# Patient Record
Sex: Female | Born: 1950 | Race: White | Hispanic: No | Marital: Married | Smoking: Never smoker
Health system: Southern US, Community
[De-identification: ages and names within clinical notes are randomized; demographics above are authoritative.]

## PROBLEM LIST (undated history)

## (undated) DIAGNOSIS — I10 Essential (primary) hypertension: Secondary | ICD-10-CM

## (undated) DIAGNOSIS — E7800 Pure hypercholesterolemia, unspecified: Secondary | ICD-10-CM

## (undated) DIAGNOSIS — K219 Gastro-esophageal reflux disease without esophagitis: Secondary | ICD-10-CM

## (undated) DIAGNOSIS — M069 Rheumatoid arthritis, unspecified: Secondary | ICD-10-CM

## (undated) DIAGNOSIS — R42 Dizziness and giddiness: Secondary | ICD-10-CM

## (undated) DIAGNOSIS — R55 Syncope and collapse: Secondary | ICD-10-CM

## (undated) HISTORY — PX: PARATHYROIDECTOMY: SHX19

## (undated) HISTORY — PX: ABDOMINAL HYSTERECTOMY: SHX81

## (undated) HISTORY — DX: Gastro-esophageal reflux disease without esophagitis: K21.9

## (undated) HISTORY — DX: Dizziness and giddiness: R42

## (undated) HISTORY — PX: WRIST GANGLION EXCISION: SHX840

## (undated) HISTORY — DX: Syncope and collapse: R55

## (undated) HISTORY — PX: CARDIAC CATHETERIZATION: SHX172

## (undated) HISTORY — PX: NECK SURGERY: SHX720

---

## 2007-05-06 ENCOUNTER — Ambulatory Visit (HOSPITAL_COMMUNITY): Admission: RE | Admit: 2007-05-06 | Discharge: 2007-05-07 | Payer: Self-pay | Admitting: Neurosurgery

## 2010-12-09 NOTE — Op Note (Signed)
NAME:  Connie Peterson, Connie Peterson               ACCOUNT NO.:  [id number]   MEDICAL RECORD NO.:  [id number]          PATIENT TYPE:  OIB   LOCATION:  5151                         FACILITY:  [facility]   PHYSICIAN:  Coletta Memos, M.D.     DATE OF BIRTH:  November 03, 1950   DATE OF PROCEDURE:  05/06/2007  DATE OF DISCHARGE:  05/07/2007                               OPERATIVE REPORT   PREOPERATIVE DIAGNOSES:  1. Cervical spondylosis without myelopathy, C6-7.  2. Cervical radiculopathy, C7, bilateral.   POSTOPERATIVE DIAGNOSES:  1. Cervical spondylosis without myelopathy, C6-7.  2. Cervical radiculopathy, C7, bilateral.   PROCEDURE:  1. Anterior cervical decompression, C6-7.  2. Arthrodesis C6-7, with 7-mm structural allograft.  3. Anterior instrumentation, 16 mm Vectra plate with 29-FA screws.   COMPLICATIONS:  None.   SURGEON:  Coletta Memos, M.D.   ASSISTANT:  None.   ANESTHESIA:  Is general endotracheal.   INDICATIONS:  Connie Peterson is a 60 year old who presented with  significant pain in the upper extremities and some weakness in the  triceps.  MRI showed what was biforaminal narrowing at the C6-7 level  along with osteophytes causing some cord stenosis.  I recommended she  agreed to undergo operative decompression of the C6-7 level.   OPERATIVE NOTE:  Connie Peterson was brought to the operating room, intubated  and placed under a general anesthesia without difficulty.  She was  positioned on a horseshoe headrest supine on the bed.  The neck was held  essentially in neutral position.  She had 5 pounds of traction applied  via a chin strap.  The neck was prepped, and she was draped in a sterile  fashion.  I infiltrated 7 mL 1/2% lidocaine 1:200,000 strength  epinephrine starting from the midline, extending to the medial border of  the left sternocleidomastoid at the level of the cricothyroid membrane.  I opened the skin with a #10 blade, and I took this down sharply to the  platysma.  I dissected  rostrally and caudally using Metzenbaum scissors  in a plane above the platysma.  I opened the platysma in a horizontal  fashion using the same Metzenbaum scissors.  I then dissected inferior  to the platysma rostrally and caudally.  I created an avascular plane  with both sharp and blunt dissection to the cervical spine traversing  corridor between the medial strap muscles and the carotid artery  sternocleidomastoid and jugular vein laterally.  I placed a spinal  needle at what I believed to be C6-7 and also one at C4-5.  X-ray showed  that the upper needle was at C4-5.  I could count down very safely to  the C6-7 level which is what I did.   I proceeded with the diskectomy and decompression first by using a #15  blade to incise the disk.  I then reflected the longus colli muscles  bilaterally and placed a self-retaining retractor to expose the disk  space at C6-7.  I then placed two distraction pins, 1 at C6 and the  other at C7, and distracted the disk space.  I then with  the use of  curettes, pituitary rongeurs, Kerrison punches and a high-speed drill  removed both disk material endplate and osteophytes.  I did this in  order to fully decompress the C6-7 level and both C7 nerve roots.  There  was perfuse venous bleeding laterally overlying the C7 nerve roots.  I  was able to control bleeding on both the right and left sides using  Gelfoam and mild pressure.  After fully decompressing the spinal canal  at C6-7 and both C7 nerve roots, I turned my attention to the  arthrodesis.   I performed the arthrodesis, first by evening the retrieval bodies of C6  and C7 so that they would accept a square graft.  I placed a 7-mm graft  after sizing the level.  The graft was secured into the disk space.  I  removed the traction and also removed the distraction pins.   I turned my attention now to the anterior instrumentation, placing a 16-  mm plate and 4 screws, 2 screws into C6 and 2 screws  into C7.  The  plate, plug and the screws were all in good position.  X-ray showed that  I was at the correct level.  I then irrigated the wound.  I then closed  wound in layered fashion, reapproximating the platysma and subsequently  the subcuticular layers.  I used Dermabond for sterile dressing.  Ms.  Gilmore was extubated, speaking well, moving all extremities when she was  taken to the recovery room.           ______________________________  Coletta Memos, M.D.     KC/MEDQ  D:  05/06/2007  T:  05/07/2007  Job:  [id number]

## 2011-05-07 LAB — BASIC METABOLIC PANEL
BUN: 14
CO2: 29
Sodium: 137

## 2011-05-07 LAB — CBC
MCHC: 34.1
RBC: 4.55
RDW: 12.2
WBC: 7.7

## 2011-12-26 DIAGNOSIS — R55 Syncope and collapse: Secondary | ICD-10-CM

## 2011-12-26 HISTORY — DX: Syncope and collapse: R55

## 2012-01-12 ENCOUNTER — Emergency Department (HOSPITAL_COMMUNITY): Payer: BC Managed Care – PPO

## 2012-01-12 ENCOUNTER — Encounter (HOSPITAL_COMMUNITY): Payer: Self-pay | Admitting: Emergency Medicine

## 2012-01-12 ENCOUNTER — Observation Stay (HOSPITAL_COMMUNITY)
Admission: EM | Admit: 2012-01-12 | Discharge: 2012-01-14 | Disposition: A | Discharge status: Home / Self Care | Payer: BC Managed Care – PPO | Attending: Internal Medicine | Admitting: Internal Medicine

## 2012-01-12 DIAGNOSIS — R55 Syncope and collapse: Secondary | ICD-10-CM

## 2012-01-12 DIAGNOSIS — I10 Essential (primary) hypertension: Secondary | ICD-10-CM

## 2012-01-12 DIAGNOSIS — E876 Hypokalemia: Secondary | ICD-10-CM | POA: Insufficient documentation

## 2012-01-12 DIAGNOSIS — R0789 Other chest pain: Secondary | ICD-10-CM | POA: Insufficient documentation

## 2012-01-12 DIAGNOSIS — E785 Hyperlipidemia, unspecified: Secondary | ICD-10-CM | POA: Diagnosis present

## 2012-01-12 DIAGNOSIS — R42 Dizziness and giddiness: Secondary | ICD-10-CM | POA: Insufficient documentation

## 2012-01-12 DIAGNOSIS — M069 Rheumatoid arthritis, unspecified: Secondary | ICD-10-CM

## 2012-01-12 DIAGNOSIS — Z79899 Other long term (current) drug therapy: Secondary | ICD-10-CM | POA: Insufficient documentation

## 2012-01-12 DIAGNOSIS — K219 Gastro-esophageal reflux disease without esophagitis: Secondary | ICD-10-CM | POA: Diagnosis present

## 2012-01-12 DIAGNOSIS — I079 Rheumatic tricuspid valve disease, unspecified: Secondary | ICD-10-CM | POA: Insufficient documentation

## 2012-01-12 HISTORY — DX: Pure hypercholesterolemia, unspecified: E78.00

## 2012-01-12 HISTORY — DX: Rheumatoid arthritis, unspecified: M06.9

## 2012-01-12 HISTORY — DX: Essential (primary) hypertension: I10

## 2012-01-12 LAB — URINE MICROSCOPIC-ADD ON

## 2012-01-12 LAB — CBC
HCT: 35.5 % — ABNORMAL LOW (ref 36.0–46.0)
MCHC: 35.8 g/dL (ref 30.0–36.0)
MCV: 87.4 fL (ref 78.0–100.0)
Platelets: 271 10*3/uL (ref 150–400)
RBC: 4.06 MIL/uL (ref 3.87–5.11)
RDW: 12.9 % (ref 11.5–15.5)
WBC: 7.2 10*3/uL (ref 4.0–10.5)

## 2012-01-12 LAB — DIFFERENTIAL
Basophils Relative: 0 % (ref 0–1)
Eosinophils Absolute: 0.1 10*3/uL (ref 0.0–0.7)
Eosinophils Relative: 2 % (ref 0–5)
Monocytes Relative: 10 % (ref 3–12)
Neutro Abs: 4 10*3/uL (ref 1.7–7.7)
Neutrophils Relative %: 55 % (ref 43–77)

## 2012-01-12 LAB — URINALYSIS, ROUTINE W REFLEX MICROSCOPIC
Hgb urine dipstick: NEGATIVE
Nitrite: NEGATIVE
Protein, ur: NEGATIVE mg/dL
Urobilinogen, UA: 0.2 mg/dL (ref 0.0–1.0)
pH: 6.5 (ref 5.0–8.0)

## 2012-01-12 LAB — COMPREHENSIVE METABOLIC PANEL
AST: 45 U/L — ABNORMAL HIGH (ref 0–37)
Albumin: 4.4 g/dL (ref 3.5–5.2)
CO2: 27 mEq/L (ref 19–32)
GFR calc Af Amer: >90 mL/min (ref >90)
Glucose, Bld: 95 mg/dL (ref 70–99)
Sodium: 135 mEq/L (ref 135–145)
Total Protein: 7.8 g/dL (ref 6.0–8.3)

## 2012-01-12 LAB — CARDIAC PANEL(CRET KIN+CKTOT+MB+TROPI)
CK, MB: 1.7 ng/mL (ref 0.3–4.0)
Relative Index: 1.6 (ref 0.0–2.5)
Total CK: 109 U/L (ref 7–177)
Troponin I: <0.3 ng/mL (ref <0.30)

## 2012-01-12 LAB — D-DIMER, QUANTITATIVE: D-Dimer, Quant: <0.22 ug/mL-FEU (ref 0.00–0.48)

## 2012-01-12 MED ORDER — SODIUM CHLORIDE 0.9 % IV BOLUS (SEPSIS)
500.0000 mL | Freq: Once | INTRAVENOUS | Status: AC
  Administered 2012-01-12: 500 mL via INTRAVENOUS

## 2012-01-12 MED ORDER — POTASSIUM CHLORIDE CRYS ER 20 MEQ PO TBCR
40.0000 meq | EXTENDED_RELEASE_TABLET | Freq: Once | ORAL | Status: AC
  Administered 2012-01-12: 40 meq via ORAL
  Filled 2012-01-12: qty 2

## 2012-01-12 NOTE — ED Notes (Signed)
Per ems: The patient started on a new Medication. The patient has had several episodes of feeling dizzy and near passing out. 1 episode yesterday of syncope for a brief moment. The patient felt poorly today went Rn - they sen there here. THe patient patient is currently awake / alert and oriented

## 2012-01-12 NOTE — ED Provider Notes (Signed)
History     CSN: [id number]  Arrival date & time 01/12/12  1759   First MD Initiated Contact with Patient 01/12/12 1825      Chief Complaint  Patient presents with  . Near Syncope    (Consider location/radiation/quality/duration/timing/severity/associated sxs/prior treatment) HPI Comments: Pt presents with syncopal episodes.  She was at [facility] yesterday and passed out for about a minute, then that evening , went to a movie and passed out while she was watching the movie.  Today, she had a similar spell while seeing her nurse practioner, but was near syncope.  She says it starts with dizziness, tingling to face, nausea, diaphoresis, then passes out.  No hx of similar spells in past.  No CP or SOB.  No other neuro symptoms.  No headache.  No balance problems.  Only new medication is remicade which she started in May for her RA.  Last injection was 5 weeks ago.  No fevers or recent illnesses.  The history is provided by the patient.    Past Medical History  Diagnosis Date  . Hypertension   . Hypercholesteremia   . Arthritis     Past Surgical History  Procedure Date  . Abdominal hysterectomy   . Neck surgery   . Parathyroidectomy   . Wrist ganglion excision     History reviewed. No pertinent family history.  History  Substance Use Topics  . Smoking status: Never Smoker   . Smokeless tobacco: Never Used  . Alcohol Use: No    OB History    Grav Para Term Preterm Abortions TAB SAB Ect Mult Living                  Review of Systems  Constitutional: Positive for diaphoresis. Negative for fever, chills and fatigue.  HENT: Negative for congestion, rhinorrhea and sneezing.   Eyes: Negative.   Respiratory: Negative for cough, chest tightness and shortness of breath.   Cardiovascular: Negative for chest pain and leg swelling.  Gastrointestinal: Positive for nausea. Negative for vomiting, abdominal pain, diarrhea and blood in stool.  Genitourinary: Negative for frequency,  hematuria, flank pain and difficulty urinating.  Musculoskeletal: Negative for back pain and arthralgias.  Skin: Negative for rash.  Neurological: Positive for dizziness and syncope. Negative for seizures, speech difficulty, weakness, numbness and headaches.    Allergies  Review of patient's allergies indicates no known allergies.  Home Medications   Current Outpatient Rx  Name Route Sig Dispense Refill  . FOLIC ACID 1 MG PO TABS Oral Take 1 mg by mouth daily.    . INFLIXIMAB 100 MG IV SOLR Intravenous Inject 100 mg into the vein See admin instructions. Gets infusion every 8 weeks    . LOSARTAN POTASSIUM-HCTZ 100-25 MG PO TABS Oral Take 1 tablet by mouth daily.    Marland Kitchen METHOTREXATE 2.5 MG PO TABS Oral Take 15 mg by mouth once a week. Every sundday.Marland KitchenMarland KitchenCaution:Chemotherapy. Protect from light.    Marland Kitchen METOPROLOL TARTRATE 100 MG PO TABS Oral Take 100 mg by mouth daily.    Marland Kitchen PANTOPRAZOLE SODIUM 40 MG PO TBEC Oral Take 40 mg by mouth daily.    Marland Kitchen SIMVASTATIN 10 MG PO TABS Oral Take 10 mg by mouth daily.      BP 126/78  Pulse 79  Temp 98.1 F (36.7 C) (Oral)  Resp 12  SpO2 99%  Physical Exam  Constitutional: She is oriented to person, place, and time. She appears well-developed and well-nourished.  HENT:  Head: Normocephalic and  atraumatic.  Eyes: Pupils are equal, round, and reactive to light.  Neck: Normal range of motion. Neck supple.  Cardiovascular: Normal rate, regular rhythm and normal heart sounds.   Pulmonary/Chest: Effort normal and breath sounds normal. No respiratory distress. She has no wheezes. She has no rales. She exhibits no tenderness.  Abdominal: Soft. Bowel sounds are normal. There is no tenderness. There is no rebound and no guarding.  Musculoskeletal: Normal range of motion. She exhibits no edema.  Lymphadenopathy:    She has no cervical adenopathy.  Neurological: She is alert and oriented to person, place, and time. She has normal strength. No cranial nerve deficit  or sensory deficit. GCS eye subscore is 4. GCS verbal subscore is 5. GCS motor subscore is 6.       FTN intact  Skin: Skin is warm and dry. No rash noted.  Psychiatric: She has a normal mood and affect.    ED Course  Procedures (including critical care time)  Results for orders placed during the hospital encounter of 01/12/12  CBC      Component Value Range   WBC 7.2  4.0 - 10.5 K/uL   RBC 4.06  3.87 - 5.11 MIL/uL   Hemoglobin 12.7  12.0 - 15.0 g/dL   HCT 29.5 (*) 62.1 - 30.8 %   MCV 87.4  78.0 - 100.0 fL   MCH 31.3  26.0 - 34.0 pg   MCHC 35.8  30.0 - 36.0 g/dL   RDW 65.7  84.6 - 96.2 %   Platelets 271  150 - 400 K/uL  DIFFERENTIAL      Component Value Range   Neutrophils Relative 55  43 - 77 %   Neutro Abs 4.0  1.7 - 7.7 K/uL   Lymphocytes Relative 33  12 - 46 %   Lymphs Abs 2.4  0.7 - 4.0 K/uL   Monocytes Relative 10  3 - 12 %   Monocytes Absolute 0.7  0.1 - 1.0 K/uL   Eosinophils Relative 2  0 - 5 %   Eosinophils Absolute 0.1  0.0 - 0.7 K/uL   Basophils Relative 0  0 - 1 %   Basophils Absolute 0.0  0.0 - 0.1 K/uL  COMPREHENSIVE METABOLIC PANEL      Component Value Range   Sodium 135  135 - 145 mEq/L   Potassium 3.1 (*) 3.5 - 5.1 mEq/L   Chloride 97  96 - 112 mEq/L   CO2 27  19 - 32 mEq/L   Glucose, Bld 95  70 - 99 mg/dL   BUN 17  6 - 23 mg/dL   Creatinine, Ser 9.52  0.50 - 1.10 mg/dL   Calcium 9.6  8.4 - 84.1 mg/dL   Total Protein 7.8  6.0 - 8.3 g/dL   Albumin 4.4  3.5 - 5.2 g/dL   AST 45 (*) 0 - 37 U/L   ALT 57 (*) 0 - 35 U/L   Alkaline Phosphatase 84  39 - 117 U/L   Total Bilirubin 0.5  0.3 - 1.2 mg/dL   GFR calc non Af Amer >90  >90 mL/min   GFR calc Af Amer >90  >90 mL/min  URINALYSIS, ROUTINE W REFLEX MICROSCOPIC      Component Value Range   Color, Urine YELLOW  YELLOW   APPearance CLEAR  CLEAR   Specific Gravity, Urine 1.011  1.005 - 1.030   pH 6.5  5.0 - 8.0   Glucose, UA NEGATIVE  NEGATIVE mg/dL   Hgb  urine dipstick NEGATIVE  NEGATIVE   Bilirubin  Urine NEGATIVE  NEGATIVE   Ketones, ur NEGATIVE  NEGATIVE mg/dL   Protein, ur NEGATIVE  NEGATIVE mg/dL   Urobilinogen, UA 0.2  0.0 - 1.0 mg/dL   Nitrite NEGATIVE  NEGATIVE   Leukocytes, UA SMALL (*) NEGATIVE  CARDIAC PANEL(CRET KIN+CKTOT+MB+TROPI)      Component Value Range   Total CK 109  7 - 177 U/L   CK, MB 1.7  0.3 - 4.0 ng/mL   Troponin I <0.30  <0.30 ng/mL   Relative Index 1.6  0.0 - 2.5  D-DIMER, QUANTITATIVE      Component Value Range   D-Dimer, Quant <0.22  0.00 - 0.48 ug/mL-FEU  URINE MICROSCOPIC-ADD ON      Component Value Range   Squamous Epithelial / LPF RARE  RARE   WBC, UA 0-2  <3 WBC/hpf   Bacteria, UA RARE  RARE   Dg Chest 2 View  01/12/2012  *RADIOLOGY REPORT*  Clinical Data: Multiple episodes of near syncope the past 2 days.  CHEST - 2 VIEW  Comparison: Two-view chest x-ray 05/02/1007.  Findings: Cardiomediastinal silhouette unremarkable, unchanged. Lungs clear.  Bronchovascular markings normal.  Pulmonary vascularity normal.  No pneumothorax.  No pleural effusions. Visualized bony thorax intact.  Prior C6-7 ACDF.  IMPRESSION: No acute cardiopulmonary disease.  Original Report Authenticated By: Arnell Sieving, M.D.   Ct Head Wo Contrast  01/12/2012  *RADIOLOGY REPORT*  Clinical Data: Multiple episodes of near syncope in the past 2 days.  CT HEAD WITHOUT CONTRAST  Technique:  Contiguous axial images were obtained from the base of the skull through the vertex without contrast.  Comparison: None.  Findings: Ventricular system normal in size and appearance for age. No mass lesion.  No midline shift.  No acute hemorrhage or hematoma.  No extra-axial fluid collections.  No evidence of acute infarction.  No focal brain parenchymal abnormalities.  No focal osseous abnormalities involving the skull.  Visualized paranasal sinuses, mastoid air cells, and middle ear cavities well- aerated.  IMPRESSION: Normal examination.  Original Report Authenticated By: Arnell Sieving, M.D.     Date: 01/12/2012  Rate: 71  Rhythm: normal sinus rhythm  QRS Axis: normal  Intervals: normal  ST/T Wave abnormalities: normal  Conduction Disutrbances:none  Narrative Interpretation:   Old EKG Reviewed: unchanged    1. Syncope       MDM  Pt with 3 syncopal type events.  Nothing to suggest ACS, may be arrhthymia, no signs of infection.  Could be related to remicade, but seems less likely given injection was 5 weeks ago.  Will consult hospitalist for admission        Rolan Bucco, MD 01/12/12 2125

## 2012-01-12 NOTE — H&P (Signed)
PCP:  Patient does not have PCP  Chief Complaint:  Near syncope today  HPI: 61 y/o with history of RA (recently started on remicade), HTN (on losartan-hctz, metoprolol), hypercholesterolemia, who presents to the Ed complaining of syncope and near syncope today.  States that yesterday she had to episodes of syncope.  First episode happened while she was waiting in line and second episode occurred while she was watching a movie.  Patient denies any reported seizure like activity, denied any tongue biting or urinary/bowel incontinence.  Prior to fainting she felt diaphoresis, nausea, and flush feeling.  Then passed out reportedly for half a minute prior to coming back to consciousness.  Today she reports that while she was at work she had a similar episode but denied passing out.  She states that she felt like she was going to faint but then she laid down and felt better.  In the ED patient had CT of head which was interpreted as normal examination.  Chest x ray showed no acute cardiopulmonary disease.  WBC was WNL, BMP was essentially normal except for hypokalemia of 3.1.  CE's were negative.  U/A was mainly WNL's.  EKG showed normal sinus rhythm with no ST elevation or depressions.   Allergies:  No Known Allergies    Past Medical History  Diagnosis Date  . Hypertension   . Hypercholesteremia   . Arthritis     Past Surgical History  Procedure Date  . Abdominal hysterectomy   . Neck surgery   . Parathyroidectomy   . Wrist ganglion excision     Prior to Admission medications   Medication Sig Start Date End Date Taking? Authorizing Provider  folic acid (FOLVITE) 1 MG tablet Take 1 mg by mouth daily.   Yes Historical Provider, MD  inFLIXimab (REMICADE) 100 MG injection Inject 100 mg into the vein See admin instructions. Gets infusion every 8 weeks   Yes Historical Provider, MD  losartan-hydrochlorothiazide (HYZAAR) 100-25 MG per tablet Take 1 tablet by mouth daily.   Yes Historical  Provider, MD  methotrexate (RHEUMATREX) 2.5 MG tablet Take 15 mg by mouth once a week. Every sundday.Marland KitchenMarland KitchenCaution:Chemotherapy. Protect from light.   Yes Historical Provider, MD  metoprolol (LOPRESSOR) 100 MG tablet Take 100 mg by mouth daily.   Yes Historical Provider, MD  pantoprazole (PROTONIX) 40 MG tablet Take 40 mg by mouth daily.   Yes Historical Provider, MD  simvastatin (ZOCOR) 10 MG tablet Take 10 mg by mouth daily.   Yes Historical Provider, MD    Social History:  reports that she has never smoked. She has never used smokeless tobacco. She reports that she does not drink alcohol or use illicit drugs.  History reviewed. No pertinent family history.  Review of Systems:  Constitutional: Denies fever, chills, diaphoresis, appetite change and fatigue.  HEENT: Denies photophobia, eye pain, redness, hearing loss, ear pain, congestion, sore throat, rhinorrhea, sneezing, mouth sores, trouble swallowing, neck pain, neck stiffness and tinnitus.   Respiratory: Denies SOB, DOE, cough, chest tightness,  and wheezing.   Cardiovascular: Denies chest pain, palpitations and leg swelling.  Gastrointestinal: Denies nausea, vomiting, abdominal pain, diarrhea, constipation, blood in stool and abdominal distention.  Genitourinary: Denies dysuria, urgency, frequency, hematuria, flank pain and difficulty urinating.  Musculoskeletal: Denies myalgias, back pain, joint swelling, arthralgias and gait problem.  Skin: Denies pallor, rash and wound.  Neurological: Denies dizziness, seizures, syncope, weakness, light-headedness, numbness and headaches.  Hematological: Denies adenopathy. Easy bruising, personal or family bleeding history  Psychiatric/Behavioral: Denies  suicidal ideation, mood changes, confusion, nervousness, sleep disturbance and agitation   Physical Exam: Blood pressure 126/78, pulse 79, temperature 98.1 F (36.7 C), temperature source Oral, resp. rate 12, SpO2 99.00%. General: Alert, awake,  oriented x3, in no acute distress. HEENT: No bruits, no goiter. Heart: Regular rate and rhythm, without murmurs, rubs, gallops. Lungs: Clear to auscultation bilaterally. Abdomen: Soft, nontender, nondistended, positive bowel sounds. Extremities: No clubbing cyanosis or edema with positive pedal pulses. Neuro: Grossly intact, nonfocal.  Labs on Admission:  Results for orders placed during the hospital encounter of 01/12/12 (from the past 48 hour(s))  CBC     Status: Abnormal   Collection Time   01/12/12  7:00 PM      Component Value Range Comment   WBC 7.2  4.0 - 10.5 K/uL    RBC 4.06  3.87 - 5.11 MIL/uL    Hemoglobin 12.7  12.0 - 15.0 g/dL    HCT 16.1 (*) 09.6 - 46.0 %    MCV 87.4  78.0 - 100.0 fL    MCH 31.3  26.0 - 34.0 pg    MCHC 35.8  30.0 - 36.0 g/dL    RDW 04.5  40.9 - 81.1 %    Platelets 271  150 - 400 K/uL   DIFFERENTIAL     Status: Normal   Collection Time   01/12/12  7:00 PM      Component Value Range Comment   Neutrophils Relative 55  43 - 77 %    Neutro Abs 4.0  1.7 - 7.7 K/uL    Lymphocytes Relative 33  12 - 46 %    Lymphs Abs 2.4  0.7 - 4.0 K/uL    Monocytes Relative 10  3 - 12 %    Monocytes Absolute 0.7  0.1 - 1.0 K/uL    Eosinophils Relative 2  0 - 5 %    Eosinophils Absolute 0.1  0.0 - 0.7 K/uL    Basophils Relative 0  0 - 1 %    Basophils Absolute 0.0  0.0 - 0.1 K/uL   COMPREHENSIVE METABOLIC PANEL     Status: Abnormal   Collection Time   01/12/12  7:00 PM      Component Value Range Comment   Sodium 135  135 - 145 mEq/L    Potassium 3.1 (*) 3.5 - 5.1 mEq/L    Chloride 97  96 - 112 mEq/L    CO2 27  19 - 32 mEq/L    Glucose, Bld 95  70 - 99 mg/dL    BUN 17  6 - 23 mg/dL    Creatinine, Ser 9.14  0.50 - 1.10 mg/dL    Calcium 9.6  8.4 - 78.2 mg/dL    Total Protein 7.8  6.0 - 8.3 g/dL    Albumin 4.4  3.5 - 5.2 g/dL    AST 45 (*) 0 - 37 U/L SLIGHT HEMOLYSIS   ALT 57 (*) 0 - 35 U/L    Alkaline Phosphatase 84  39 - 117 U/L    Total Bilirubin 0.5  0.3 - 1.2  mg/dL    GFR calc non Af Amer >90  >90 mL/min    GFR calc Af Amer >90  >90 mL/min   CARDIAC PANEL(CRET KIN+CKTOT+MB+TROPI)     Status: Normal   Collection Time   01/12/12  7:00 PM      Component Value Range Comment   Total CK 109  7 - 177 U/L    CK, MB 1.7  0.3 - 4.0 ng/mL    Troponin I <0.30  <0.30 ng/mL    Relative Index 1.6  0.0 - 2.5   D-DIMER, QUANTITATIVE     Status: Normal   Collection Time   01/12/12  7:00 PM      Component Value Range Comment   D-Dimer, Quant <0.22  0.00 - 0.48 ug/mL-FEU   URINALYSIS, ROUTINE W REFLEX MICROSCOPIC     Status: Abnormal   Collection Time   01/12/12  7:17 PM      Component Value Range Comment   Color, Urine YELLOW  YELLOW    APPearance CLEAR  CLEAR    Specific Gravity, Urine 1.011  1.005 - 1.030    pH 6.5  5.0 - 8.0    Glucose, UA NEGATIVE  NEGATIVE mg/dL    Hgb urine dipstick NEGATIVE  NEGATIVE    Bilirubin Urine NEGATIVE  NEGATIVE    Ketones, ur NEGATIVE  NEGATIVE mg/dL    Protein, ur NEGATIVE  NEGATIVE mg/dL    Urobilinogen, UA 0.2  0.0 - 1.0 mg/dL    Nitrite NEGATIVE  NEGATIVE    Leukocytes, UA SMALL (*) NEGATIVE   URINE MICROSCOPIC-ADD ON     Status: Normal   Collection Time   01/12/12  7:17 PM      Component Value Range Comment   Squamous Epithelial / LPF RARE  RARE    WBC, UA 0-2  <3 WBC/hpf    Bacteria, UA RARE  RARE     Radiological Exams on Admission: Dg Chest 2 View  01/12/2012  *RADIOLOGY REPORT*  Clinical Data: Multiple episodes of near syncope the past 2 days.  CHEST - 2 VIEW  Comparison: Two-view chest x-ray 05/02/1007.  Findings: Cardiomediastinal silhouette unremarkable, unchanged. Lungs clear.  Bronchovascular markings normal.  Pulmonary vascularity normal.  No pneumothorax.  No pleural effusions. Visualized bony thorax intact.  Prior C6-7 ACDF.  IMPRESSION: No acute cardiopulmonary disease.  Original Report Authenticated By: Arnell Sieving, M.D.   Ct Head Wo Contrast  01/12/2012  *RADIOLOGY REPORT*  Clinical Data:  Multiple episodes of near syncope in the past 2 days.  CT HEAD WITHOUT CONTRAST  Technique:  Contiguous axial images were obtained from the base of the skull through the vertex without contrast.  Comparison: None.  Findings: Ventricular system normal in size and appearance for age. No mass lesion.  No midline shift.  No acute hemorrhage or hematoma.  No extra-axial fluid collections.  No evidence of acute infarction.  No focal brain parenchymal abnormalities.  No focal osseous abnormalities involving the skull.  Visualized paranasal sinuses, mastoid air cells, and middle ear cavities well- aerated.  IMPRESSION: Normal examination.  Original Report Authenticated By: Arnell Sieving, M.D.    Assessment/Plan Active Problems: Syncope: - at this point suspecting orthostatic hypotension given history - Will look for other causes of syncope. Obtain Echocardiogram, carotid dopplers, orthostatic vitals - Monitor in telemetry for signs of arrythmias - given that patient did not have any seizure like activity reported will defer EEG at this juncture - Could be related to recent remicaid medication given that this can produce hypotension and there have been averse reactions reported for seizures but given history it is not consistent with seizure like activity.  HTN Stable. Will plan on continuing metoprolol at this juncture.  Hypokalemia - likely related to hctz.  Will go ahead and hold at this juncture. - replace orally x 1 today.  HPL - stable continue statin  GERD - Continue  protonix  DVT -heparin  RA - continue methotrexate at this juncture.  Disposition:  Pending results of lab imaging studies and lab work up.  Should work up come back negative patient may benefit from tilt table testing or change in her antihypertensive medication.  Will place care manager consult so patient can obtain list of PCP's in the area.   Time Spent on Admission: > 55 minutes discussing case with ed doctor,  placing orders, medical decision making, documenting, physical exam, obtaining history from patient, billing, updating information systems.  Penny Pia [facility] Pager: [phone] 01/12/2012, 9:40 PM

## 2012-01-13 ENCOUNTER — Encounter (HOSPITAL_COMMUNITY): Payer: Self-pay | Admitting: Physician Assistant

## 2012-01-13 ENCOUNTER — Observation Stay (HOSPITAL_COMMUNITY): Payer: BC Managed Care – PPO

## 2012-01-13 DIAGNOSIS — E876 Hypokalemia: Secondary | ICD-10-CM

## 2012-01-13 DIAGNOSIS — I369 Nonrheumatic tricuspid valve disorder, unspecified: Secondary | ICD-10-CM

## 2012-01-13 DIAGNOSIS — I10 Essential (primary) hypertension: Secondary | ICD-10-CM

## 2012-01-13 DIAGNOSIS — R55 Syncope and collapse: Secondary | ICD-10-CM

## 2012-01-13 DIAGNOSIS — M069 Rheumatoid arthritis, unspecified: Secondary | ICD-10-CM

## 2012-01-13 LAB — CBC
Hemoglobin: 11.3 g/dL — ABNORMAL LOW (ref 12.0–15.0)
MCH: 30.5 pg (ref 26.0–34.0)
MCHC: 35 g/dL (ref 30.0–36.0)
MCV: 87.3 fL (ref 78.0–100.0)

## 2012-01-13 LAB — BASIC METABOLIC PANEL
BUN: 13 mg/dL (ref 6–23)
CO2: 25 mEq/L (ref 19–32)
Calcium: 9.2 mg/dL (ref 8.4–10.5)
Creatinine, Ser: 0.7 mg/dL (ref 0.50–1.10)
GFR calc non Af Amer: >90 mL/min (ref >90)
Glucose, Bld: 90 mg/dL (ref 70–99)
Sodium: 139 mEq/L (ref 135–145)

## 2012-01-13 LAB — MAGNESIUM: Magnesium: 1.9 mg/dL (ref 1.5–2.5)

## 2012-01-13 LAB — POTASSIUM: Potassium: 3.5 mEq/L (ref 3.5–5.1)

## 2012-01-13 MED ORDER — POTASSIUM CHLORIDE CRYS ER 20 MEQ PO TBCR
40.0000 meq | EXTENDED_RELEASE_TABLET | Freq: Once | ORAL | Status: AC
  Administered 2012-01-13: 40 meq via ORAL
  Filled 2012-01-13: qty 2

## 2012-01-13 MED ORDER — SODIUM CHLORIDE 0.9 % IV SOLN: 250.0000 mL | INTRAVENOUS | Status: DC | PRN

## 2012-01-13 MED ORDER — SODIUM CHLORIDE 0.9 % IJ SOLN
3.0000 mL | Freq: Two times a day (BID) | INTRAMUSCULAR | Status: DC
  Administered 2012-01-13: 3 mL via INTRAVENOUS

## 2012-01-13 MED ORDER — METOPROLOL TARTRATE 100 MG PO TABS
100.0000 mg | ORAL_TABLET | Freq: Every day | ORAL | Status: DC
  Administered 2012-01-13: 100 mg via ORAL
  Filled 2012-01-13 (×2): qty 1

## 2012-01-13 MED ORDER — PANTOPRAZOLE SODIUM 40 MG PO TBEC
40.0000 mg | DELAYED_RELEASE_TABLET | Freq: Every day | ORAL | Status: DC
  Administered 2012-01-13: 40 mg via ORAL
  Filled 2012-01-13 (×2): qty 1

## 2012-01-13 MED ORDER — METHOTREXATE 2.5 MG PO TABS: 15.0000 mg | ORAL_TABLET | ORAL | Status: DC

## 2012-01-13 MED ORDER — MAGNESIUM SULFATE IN D5W 10-5 MG/ML-% IV SOLN
1.0000 g | Freq: Once | INTRAVENOUS | Status: AC
  Administered 2012-01-13: 1 g via INTRAVENOUS
  Filled 2012-01-13: qty 100

## 2012-01-13 MED ORDER — POTASSIUM CHLORIDE IN NACL 20-0.9 MEQ/L-% IV SOLN
INTRAVENOUS | Status: AC
  Administered 2012-01-13 – 2012-01-14 (×2): via INTRAVENOUS
  Filled 2012-01-13 (×2): qty 1000

## 2012-01-13 MED ORDER — HEPARIN SODIUM (PORCINE) 5000 UNIT/ML IJ SOLN
5000.0000 [IU] | Freq: Three times a day (TID) | INTRAMUSCULAR | Status: DC
  Administered 2012-01-13 – 2012-01-14 (×4): 5000 [IU] via SUBCUTANEOUS
  Filled 2012-01-13 (×7): qty 1

## 2012-01-13 MED ORDER — ACETAMINOPHEN 650 MG RE SUPP: 650.0000 mg | Freq: Four times a day (QID) | RECTAL | Status: DC | PRN

## 2012-01-13 MED ORDER — FOLIC ACID 1 MG PO TABS
1.0000 mg | ORAL_TABLET | Freq: Every day | ORAL | Status: DC
  Administered 2012-01-13: 1 mg via ORAL
  Filled 2012-01-13 (×2): qty 1

## 2012-01-13 MED ORDER — IOHEXOL 350 MG/ML SOLN
100.0000 mL | Freq: Once | INTRAVENOUS | Status: AC | PRN
  Administered 2012-01-13: 100 mL via INTRAVENOUS

## 2012-01-13 MED ORDER — SODIUM CHLORIDE 0.9 % IJ SOLN: 3.0000 mL | Freq: Two times a day (BID) | INTRAMUSCULAR | Status: DC

## 2012-01-13 MED ORDER — SIMVASTATIN 10 MG PO TABS
10.0000 mg | ORAL_TABLET | Freq: Every day | ORAL | Status: DC
  Administered 2012-01-13: 10 mg via ORAL
  Filled 2012-01-13 (×2): qty 1

## 2012-01-13 MED ORDER — SODIUM CHLORIDE 0.9 % IJ SOLN: 3.0000 mL | INTRAMUSCULAR | Status: DC | PRN

## 2012-01-13 MED ORDER — ACETAMINOPHEN 325 MG PO TABS: 650.0000 mg | ORAL_TABLET | Freq: Four times a day (QID) | ORAL | Status: DC | PRN

## 2012-01-13 MED ORDER — ONDANSETRON HCL 4 MG/2ML IJ SOLN: 4.0000 mg | Freq: Four times a day (QID) | INTRAMUSCULAR | Status: DC | PRN

## 2012-01-13 MED ORDER — ONDANSETRON HCL 4 MG PO TABS: 4.0000 mg | ORAL_TABLET | Freq: Four times a day (QID) | ORAL | Status: DC | PRN

## 2012-01-13 NOTE — Progress Notes (Addendum)
[facility]                                                                                Patient Demographics  Connie Peterson, is a 61 y.o. female  ZOX:[id number]  [id number]  DOB - 13-Nov-1950  Admit date - 01/12/2012  Admitting Physician Earlean Polka, MD  Outpatient Primary MD for the patient is No primary provider on file.  LOS - 1   Chief Complaint  Patient presents with  . Near Syncope        Subjective:   Connie Peterson today has, No headache, No chest pain, No abdominal pain - No Nausea, No new weakness tingling or numbness, No Cough - SOB.    Objective:   Filed Vitals:   01/13/12 0510 01/13/12 0515 01/13/12 1103 01/13/12 1352  BP: 148/86 147/84 156/77 120/82  Pulse: 62 66 82 60  Temp:    97.8 F (36.6 C)  TempSrc:    Oral  Resp:      Height:      Weight:      SpO2:    98%    Wt Readings from Last 3 Encounters:  01/13/12 78.3 kg (172 lb 9.9 oz)     Intake/Output Summary (Last 24 hours) at 01/13/12 1638 Last data filed at 01/13/12 1629  Gross per 24 hour  Intake    360 ml  Output   1475 ml  Net  -1115 ml    Exam Awake Alert, Oriented *3, No new F.N deficits, Normal affect Reynolds.AT,PERRAL Supple Neck,No JVD, No cervical lymphadenopathy appriciated.  Symmetrical Chest wall movement, Good air movement bilaterally, CTAB RRR,No Gallops,Rubs or new Murmurs, No Parasternal Heave +ve B.Sounds, Abd Soft, Non tender, No organomegaly appriciated, No rebound -guarding or rigidity. No Cyanosis, Clubbing or edema, No new Rash or bruise     Data Review  CBC  Lab 01/13/12 0452 01/12/12 1900  WBC 5.9 7.2  HGB 11.3* 12.7  HCT 32.3* 35.5*  PLT 223 271  MCV 87.3 87.4  MCH 30.5 31.3  MCHC 35.0 35.8  RDW 13.2 12.9  LYMPHSABS -- 2.4  MONOABS -- 0.7  EOSABS -- 0.1  BASOSABS -- 0.0  BANDABS -- --    Chemistries   Lab 01/13/12 0452 01/12/12 1900  NA 139 135  K 3.4* 3.1*  CL 106 97  CO2 25 27  GLUCOSE 90 95  BUN 13 17   CREATININE 0.70 0.73  CALCIUM 9.2 9.6  MG -- --  AST -- 45*  ALT -- 57*  ALKPHOS -- 84  BILITOT -- 0.5   ------------------------------------------------------------------------------------------------------------------ estimated creatinine clearance is 76 ml/min (by C-G formula based on Cr of 0.7). ------------------------------------------------------------------------------------------------------------------ No results found for this basename: HGBA1C:2 in the last 72 hours ------------------------------------------------------------------------------------------------------------------ No results found for this basename: CHOL:2,HDL:2,LDLCALC:2,TRIG:2,CHOLHDL:2,LDLDIRECT:2 in the last 72 hours ------------------------------------------------------------------------------------------------------------------ No results found for this basename: TSH,T4TOTAL,FREET3,T3FREE,THYROIDAB in the last 72 hours ------------------------------------------------------------------------------------------------------------------ No results found for this basename: VITAMINB12:2,FOLATE:2,FERRITIN:2,TIBC:2,IRON:2,RETICCTPCT:2 in the last 72 hours  Coagulation profile No results found for this basename: INR:5,PROTIME:5 in the last 168 hours   Basename 01/12/12 1900  DDIMER <0.22    Cardiac Enzymes  Lab 01/12/12 1900  CKMB  1.7  TROPONINI <0.30  MYOGLOBIN --   ------------------------------------------------------------------------------------------------------------------ No components found with this basename: POCBNP:3  Micro Results No results found for this or any previous visit (from the past 240 hour(s)).  Radiology Reports Dg Chest 2 View  01/12/2012  *RADIOLOGY REPORT*  Clinical Data: Multiple episodes of near syncope the past 2 days.  CHEST - 2 VIEW  Comparison: Two-view chest x-ray 05/02/1007.  Findings: Cardiomediastinal silhouette unremarkable, unchanged. Lungs clear.   Bronchovascular markings normal.  Pulmonary vascularity normal.  No pneumothorax.  No pleural effusions. Visualized bony thorax intact.  Prior C6-7 ACDF.  IMPRESSION: No acute cardiopulmonary disease.  Original Report Authenticated By: Arnell Sieving, M.D.   Ct Head Wo Contrast  01/12/2012  *RADIOLOGY REPORT*  Clinical Data: Multiple episodes of near syncope in the past 2 days.  CT HEAD WITHOUT CONTRAST  Technique:  Contiguous axial images were obtained from the base of the skull through the vertex without contrast.  Comparison: None.  Findings: Ventricular system normal in size and appearance for age. No mass lesion.  No midline shift.  No acute hemorrhage or hematoma.  No extra-axial fluid collections.  No evidence of acute infarction.  No focal brain parenchymal abnormalities.  No focal osseous abnormalities involving the skull.  Visualized paranasal sinuses, mastoid air cells, and middle ear cavities well- aerated.  IMPRESSION: Normal examination.  Original Report Authenticated By: Arnell Sieving, M.D.    Scheduled Meds:   . folic acid  1 mg Oral Daily  . heparin  5,000 Units Subcutaneous Q8H  . methotrexate  15 mg Oral Weekly  . metoprolol  100 mg Oral Daily  . pantoprazole  40 mg Oral Daily  . potassium chloride  40 mEq Oral Once  . potassium chloride  40 mEq Oral Once  . simvastatin  10 mg Oral Daily  . sodium chloride  500 mL Intravenous Once  . sodium chloride  3 mL Intravenous Q12H  . sodium chloride  3 mL Intravenous Q12H  . DISCONTD: methotrexate  15 mg Oral Weekly   Continuous Infusions:  PRN Meds:.sodium chloride, acetaminophen, acetaminophen, ondansetron (ZOFRAN) IV, ondansetron, sodium chloride  Assessment & Plan    Syncope:   - Likely orthostatic hypotension VS Vaso Vagal given history , tele stable , Cards input as may benefit from Event Monitor. - Will look for other causes of syncope. Pending Echocardiogram, carotid dopplers stable, not orthostatic    -  given that patient did not have any seizure like activity reported will defer EEG at this juncture  - Could be related to recent remicaid medication given that this can produce hypotension and there have been averse reactions reported for seizures but given history it is not consistent with seizure like activity- if reoccurs will request CPC to DC this new Med.   Addendum - 5 beat run of NSVT at 6.30pm, check lytes, IV Mag, D/W Cards RV large on echo(being read) will check Ct angio stat.    HTN   Stable. Will plan on continuing metoprolol at this juncture. DC HCTZ.    HPL  - stable continue statin     GERD  - Continue protonix     RA  - continue methotrexate at this juncture.   Low K - Replace and recheck   DVT Prophylaxis  SCDs - Heparin      Leroy Sea M.D on 01/13/2012 at 4:38 PM  Between 7am to 7pm - Pager - [phone]  After 7pm go to [email or web address] - password  [email or web address]  And look for the night coverage person covering for me after hours  [facility] Office  [phone]

## 2012-01-13 NOTE — Progress Notes (Signed)
*  PRELIMINARY RESULTS* Vascular Ultrasound Carotid Duplex (Doppler) has been completed.  Preliminary findings: Bilaterally no significant ICA stenosis with antegrade vertebral flow.  Farrel Demark, RDMS Elpidio Galea, RDMS, RDCS 01/13/2012, 10:52 AM

## 2012-01-13 NOTE — ED Notes (Signed)
Assisting patient with repositioning in bed and patient  Stated that feelings of syncope starts with a warm flushed feeling and nausea that happens first prior to syncopal episode.

## 2012-01-13 NOTE — Progress Notes (Signed)
  Echocardiogram 2D Echocardiogram has been performed.  Connie Peterson 01/13/2012, 2:21 PM

## 2012-01-13 NOTE — Progress Notes (Signed)
Paged/texted  to night on call MD/NP K Craige Cotta  about result  Of K+level and mag level and CT angio as ordered, no new order received.

## 2012-01-13 NOTE — Progress Notes (Signed)
Patient had a 5 beat run of V-tach asymptomatic at time of irregular rhythm notified Dr. Thedore Mins new orders received.

## 2012-01-13 NOTE — Progress Notes (Signed)
Pt's am Orthostatic VS Lying 129/82, 66,  Sitting 148/86, 62,  Standing 147/84, 66. Will continue to montior.Newman Nip Greenwood

## 2012-01-13 NOTE — Consult Note (Signed)
CARDIOLOGY CONSULT NOTE  Patient ID: Connie Peterson, MRN: [id number], DOB/AGE: Jul 13, 1951 61 y.o. Admit date: 01/12/2012   Date of Consult: 01/13/2012 Primary Cardiologist: New  Chief Complaint: almost passed out  HPI: 61 y/o F with hx RA, HTN, HL presented with presyncope. Last week while in line at [employer] she had an episode of nausea, lightheadedness and dizziness & felt like she was going to pass out. She sat down and got some water, then got something to eat and felt better but about 1/2 hour later while in a 3D movie the same thing happened again. She leaned back in the chair and waited for it to pass; by the time the movie was over she felt better. She took her BP at home and it was 98 systolic. The patient denied any reported seizure like activity, denied any tongue biting or urinary/bowel incontinence. Yesterday while at work she had a similar episode and felt like she was going to pass out with associated face tingling; she laid down then saw the NP at her work and BP was 160/90, CBG was 103 - she was subsequently sent to the ER. No palpitations, CP, SOB preceding either event. No total LOC.  She recently started Remicade for RA and got her 3rd treatment in May. Otherwise no recent med changes. No recent CP, SOB. No recent vomiting, diarrhea, visual changes at other times. No recent increase in stress. She does note she gets dizzy when standing sometimes. Initial orthostatics on admission showed Orthostatic VS Lying 129/82, 66, Sitting 148/86, 62, Standing 147/84, 66. She is not tachycardic, tachypnic, or hypoxic. Labs are significant for hypokalemia, mild anemia (Hgb 11.3), D-dimer negative, CEs neg x 1. Carotid dopplers neg so far. No events on telemetry.   Past Medical History  Diagnosis Date  . Hypertension   . Hypercholesteremia   . Rheumatoid arthritis       Most Recent Cardiac Studies: 2D echo pending   Surgical History:  Past Surgical History  Procedure Date  .  Abdominal hysterectomy   . Neck surgery   . Parathyroidectomy   . Wrist ganglion excision      Home Meds: Prior to Admission medications   Medication Sig Start Date End Date Taking? Authorizing Provider  folic acid (FOLVITE) 1 MG tablet Take 1 mg by mouth daily.   Yes Historical Provider, MD  inFLIXimab (REMICADE) 100 MG injection Inject 100 mg into the vein See admin instructions. Gets infusion every 8 weeks   Yes Historical Provider, MD  losartan-hydrochlorothiazide (HYZAAR) 100-25 MG per tablet Take 1 tablet by mouth daily.   Yes Historical Provider, MD  methotrexate (RHEUMATREX) 2.5 MG tablet Take 15 mg by mouth once a week. Every sundday.Marland KitchenMarland KitchenCaution:Chemotherapy. Protect from light.   Yes Historical Provider, MD  metoprolol (LOPRESSOR) 100 MG tablet Take 100 mg by mouth daily.   Yes Historical Provider, MD  pantoprazole (PROTONIX) 40 MG tablet Take 40 mg by mouth daily.   Yes Historical Provider, MD  simvastatin (ZOCOR) 10 MG tablet Take 10 mg by mouth daily.   Yes Historical Provider, MD    Inpatient Medications:     . folic acid  1 mg Oral Daily  . heparin  5,000 Units Subcutaneous Q8H  . methotrexate  15 mg Oral Weekly  . metoprolol  100 mg Oral Daily  . pantoprazole  40 mg Oral Daily  . potassium chloride  40 mEq Oral Once  . simvastatin  10 mg Oral Daily  . sodium chloride  500 mL Intravenous Once  . sodium chloride  3 mL Intravenous Q12H  . sodium chloride  3 mL Intravenous Q12H  . DISCONTD: methotrexate  15 mg Oral Weekly    Allergies: No Known Allergies  History   Social History  . Marital Status: Married    Spouse Name: N/A    Number of Children: N/A  . Years of Education: N/A   Occupational History  . Not on file.   Social History Main Topics  . Smoking status: Never Smoker   . Smokeless tobacco: Never Used  . Alcohol Use: No  . Drug Use: No  . Sexually Active:    Other Topics Concern  . Not on file   Social History Narrative  . No narrative on  file     Family History  Problem Relation Age of Onset  . Heart failure Mother      Review of Systems: General: negative for chills, fever, night sweats or weight changes.  Cardiovascular: negative for chest pain, edema, orthopnea, palpitations, paroxysmal nocturnal dyspnea, shortness of breath or dyspnea on exertion. See above Dermatological: negative for rash Respiratory: negative for cough or wheezing Urologic: negative for hematuria Abdominal: negative for vomiting, diarrhea, bright red blood per rectum, melena, or hematemesis. +nausea Neurologic: see above All other systems reviewed and are otherwise negative except as noted above.  Labs:  [facility] 01/12/12 1900  CKTOTAL 109  CKMB 1.7  TROPONINI <0.30   Lab Results  Component Value Date   WBC 5.9 01/13/2012   HGB 11.3* 01/13/2012   HCT 32.3* 01/13/2012   MCV 87.3 01/13/2012   PLT 223 01/13/2012     Lab 01/13/12 0452 01/12/12 1900  NA 139 --  K 3.4* --  CL 106 --  CO2 25 --  BUN 13 --  CREATININE 0.70 --  CALCIUM 9.2 --  PROT -- 7.8  BILITOT -- 0.5  ALKPHOS -- 84  ALT -- 57*  AST -- 45*  GLUCOSE 90 --    Lab Results  Component Value Date   DDIMER <0.22 01/12/2012    Radiology/Studies:  1. Chest 2 View 01/12/2012  *RADIOLOGY REPORT*  Clinical Data: Multiple episodes of near syncope the past 2 days.  CHEST - 2 VIEW  Comparison: Two-view chest x-ray 05/02/1007.  Findings: Cardiomediastinal silhouette unremarkable, unchanged. Lungs clear.  Bronchovascular markings normal.  Pulmonary vascularity normal.  No pneumothorax.  No pleural effusions. Visualized bony thorax intact.  Prior C6-7 ACDF.  IMPRESSION: No acute cardiopulmonary disease.  Original Report Authenticated By: Arnell Sieving, M.D.   2. Ct Head Wo Contrast 01/12/2012  *RADIOLOGY REPORT*  Clinical Data: Multiple episodes of near syncope in the past 2 days.  CT HEAD WITHOUT CONTRAST  Technique:  Contiguous axial images were obtained from the base of the  skull through the vertex without contrast.  Comparison: None.  Findings: Ventricular system normal in size and appearance for age. No mass lesion.  No midline shift.  No acute hemorrhage or hematoma.  No extra-axial fluid collections.  No evidence of acute infarction.  No focal brain parenchymal abnormalities.  No focal osseous abnormalities involving the skull.  Visualized paranasal sinuses, mastoid air cells, and middle ear cavities well- aerated.  IMPRESSION: Normal examination.  Original Report Authenticated By: Arnell Sieving, M.D.   EKG: NSR 71bpm TWI III, no prior to compare to. Normal PR, QTc  Physical Exam: Blood pressure 120/82, pulse 60, temperature 97.8 F (36.6 C), temperature source Oral, resp. rate 18, height 5'  3" (1.6 m), weight 172 lb 9.9 oz (78.3 kg), SpO2 98.00%. General: Well developed, well nourished WF in no acute distress. Head: Normocephalic, atraumatic, sclera non-icteric, no xanthomas, nares are without discharge.  Neck: Negative for carotid bruits. JVD not elevated. Lungs: Clear bilaterally to auscultation without wheezes, rales, or rhonchi. Breathing is unlabored. Heart: RRR with S1 S2. Very slight outflow tract murmur, slight increase with valsalva. No rubs or gallops appreciated. Abdomen: Soft, non-tender, non-distended with normoactive bowel sounds. No hepatomegaly. No rebound/guarding. No obvious abdominal masses. Msk:  Strength and tone appear normal for age. Extremities: No clubbing or cyanosis. No edema.  Distal pedal pulses are 2+ and equal bilaterally. Neuro: Alert and oriented X 3. Moves all extremities spontaneously. Psych:  Responds to questions appropriately with a normal affect.   Assessment and Plan:   1. Multiple episodes of presyncope 2. H/o RA, on remicade 3. Hypokalemia 4. Mild anemia, should be followed as OP 5. H/o HTN  The patient was seen and examined in conjunction with Dr. Gala Romney. See below for comprehensive  thoughts.  Signed, Ronie Spies PA-C 01/13/2012, 4:03 PM   Patient seen and examined with Ronie Spies, PA-C. We discussed all aspects of the encounter. I agree with the assessment and plan as stated above. Labs, tele and previous notes reviewed personally.   I suspect her episodes are likely due to vasovagal syncope (vs orthostasis). I do not see any events on telemetry to explain. Await echo results. Would suggest stopping her HCTZ, liberalizing salt and fluid intake and following up on echo. If echo normal can d/c home with outpatient event monitor (we will arrange). If events continue can consider outpatient tilt-table testing though I do not think this is necessary at this point. I discussed this with her in detail.   Angelette Ganus,MD 4:13 PM

## 2012-01-14 ENCOUNTER — Encounter (HOSPITAL_COMMUNITY)
Admission: EM | Disposition: A | Discharge status: Home / Self Care | Payer: Self-pay | Source: Home / Self Care | Attending: Internal Medicine

## 2012-01-14 ENCOUNTER — Encounter (INDEPENDENT_AMBULATORY_CARE_PROVIDER_SITE_OTHER): Payer: BC Managed Care – PPO

## 2012-01-14 DIAGNOSIS — R55 Syncope and collapse: Secondary | ICD-10-CM

## 2012-01-14 DIAGNOSIS — M069 Rheumatoid arthritis, unspecified: Secondary | ICD-10-CM

## 2012-01-14 DIAGNOSIS — I10 Essential (primary) hypertension: Secondary | ICD-10-CM

## 2012-01-14 DIAGNOSIS — E876 Hypokalemia: Secondary | ICD-10-CM

## 2012-01-14 HISTORY — PX: RIGHT HEART CATHETERIZATION: SHX5447

## 2012-01-14 LAB — POCT I-STAT 3, VENOUS BLOOD GAS (G3P V)
Acid-base deficit: 5 mmol/L — ABNORMAL HIGH (ref 0.0–2.0)
Bicarbonate: 20.1 mEq/L (ref 20.0–24.0)
O2 Saturation: 72 %
TCO2: 21 mmol/L (ref 0–100)

## 2012-01-14 LAB — POTASSIUM: Potassium: 4.2 mEq/L (ref 3.5–5.1)

## 2012-01-14 SURGERY — RIGHT HEART CATH
Anesthesia: LOCAL

## 2012-01-14 MED ORDER — SODIUM CHLORIDE 0.9 % IJ SOLN: 3.0000 mL | INTRAMUSCULAR | Status: DC | PRN

## 2012-01-14 MED ORDER — MIDAZOLAM HCL 2 MG/2ML IJ SOLN
INTRAMUSCULAR | Status: AC
  Filled 2012-01-14: qty 2

## 2012-01-14 MED ORDER — ASPIRIN 81 MG PO CHEW
324.0000 mg | CHEWABLE_TABLET | ORAL | Status: DC
  Filled 2012-01-14: qty 4

## 2012-01-14 MED ORDER — SODIUM CHLORIDE 0.9 % IV SOLN: INTRAVENOUS | Status: DC

## 2012-01-14 MED ORDER — ACETAMINOPHEN 325 MG PO TABS: 650.0000 mg | ORAL_TABLET | ORAL | Status: DC | PRN

## 2012-01-14 MED ORDER — DIAZEPAM 5 MG PO TABS
5.0000 mg | ORAL_TABLET | ORAL | Status: AC
  Administered 2012-01-14: 5 mg via ORAL
  Filled 2012-01-14: qty 1

## 2012-01-14 MED ORDER — ASPIRIN EC 325 MG PO TBEC
325.0000 mg | DELAYED_RELEASE_TABLET | Freq: Every day | ORAL | Status: DC
  Administered 2012-01-14: 325 mg via ORAL
  Filled 2012-01-14: qty 1

## 2012-01-14 MED ORDER — SODIUM CHLORIDE 0.9 % IV SOLN: 250.0000 mL | INTRAVENOUS | Status: DC

## 2012-01-14 MED ORDER — SODIUM CHLORIDE 0.9 % IJ SOLN: 3.0000 mL | Freq: Two times a day (BID) | INTRAMUSCULAR | Status: DC

## 2012-01-14 MED ORDER — HEPARIN (PORCINE) IN NACL 2-0.9 UNIT/ML-% IJ SOLN
INTRAMUSCULAR | Status: AC
  Filled 2012-01-14: qty 1000

## 2012-01-14 MED ORDER — SODIUM CHLORIDE 0.9 % IV SOLN: 250.0000 mL | INTRAVENOUS | Status: DC | PRN

## 2012-01-14 MED ORDER — SODIUM CHLORIDE 0.45 % IV SOLN
INTRAVENOUS | Status: DC
  Administered 2012-01-14: 10:00:00 via INTRAVENOUS

## 2012-01-14 MED ORDER — LIDOCAINE HCL (PF) 1 % IJ SOLN
INTRAMUSCULAR | Status: AC
  Filled 2012-01-14: qty 30

## 2012-01-14 MED ORDER — ONDANSETRON HCL 4 MG/2ML IJ SOLN: 4.0000 mg | Freq: Four times a day (QID) | INTRAMUSCULAR | Status: DC | PRN

## 2012-01-14 MED ORDER — OXYCODONE-ACETAMINOPHEN 5-325 MG PO TABS: 1.0000 | ORAL_TABLET | ORAL | Status: DC | PRN

## 2012-01-14 MED ORDER — NITROGLYCERIN 0.2 MG/ML ON CALL CATH LAB
INTRAVENOUS | Status: AC
  Filled 2012-01-14: qty 1

## 2012-01-14 NOTE — Progress Notes (Signed)
SUBJECTIVE: No complaints this am.   BP 131/77  Pulse 60  Temp 97.6 F (36.4 C) (Oral)  Resp 20  Ht 5\' 3"  (1.6 m)  Wt 172 lb 9.9 oz (78.3 kg)  BMI 30.58 kg/m2  SpO2 98%  Intake/Output Summary (Last 24 hours) at 01/14/12 0711 Last data filed at 01/14/12 0645  Gross per 24 hour  Intake   1390 ml  Output   1575 ml  Net   -185 ml    PHYSICAL EXAM General: Well developed, well nourished, in no acute distress. Alert and oriented x 3.  Psych:  Good affect, responds appropriately Neck: No JVD. No masses noted.  Lungs: Clear bilaterally with no wheezes or rhonci noted.  Heart: RRR with no murmurs noted. Abdomen: Bowel sounds are present. Soft, non-tender.  Extremities: No lower extremity edema.   LABS: Basic Metabolic Panel:  Basename 01/14/12 0503 01/13/12 1900 01/13/12 0452 01/12/12 1900  NA -- -- 139 135  K 4.2 3.5 -- --  CL -- -- 106 97  CO2 -- -- 25 27  GLUCOSE -- -- 90 95  BUN -- -- 13 17  CREATININE -- -- 0.70 0.73  CALCIUM -- -- 9.2 9.6  MG -- 1.9 -- --  PHOS -- -- -- --   CBC:  Basename 01/13/12 0452 01/12/12 1900  WBC 5.9 7.2  NEUTROABS -- 4.0  HGB 11.3* 12.7  HCT 32.3* 35.5*  MCV 87.3 87.4  PLT 223 271   Cardiac Enzymes:  Basename 01/12/12 1900  CKTOTAL 109  CKMB 1.7  CKMBINDEX --  TROPONINI <0.30   Current Meds:    . folic acid  1 mg Oral Daily  . heparin  5,000 Units Subcutaneous Q8H  . magnesium sulfate 1 - 4 g bolus IVPB  1 g Intravenous Once  . methotrexate  15 mg Oral Weekly  . metoprolol  100 mg Oral Daily  . pantoprazole  40 mg Oral Daily  . potassium chloride  40 mEq Oral Once  . simvastatin  10 mg Oral Daily  . sodium chloride  3 mL Intravenous Q12H  . sodium chloride  3 mL Intravenous Q12H   Echo: 01/13/12:  Left ventricle: The cavity size was normal. Wall thickness was normal. Systolic function was normal. The estimated ejection fraction was in the range of 55% to 60%. Wall motion was normal; there were no regional  wall motion abnormalities. Doppler parameters are consistent with abnormal left ventricular relaxation (grade 1 diastolic dysfunction). - Ventricular septum: Septal motion showed "bounce". - Right ventricle: The cavity size was mildly to moderately dilated. Wall thickness was normal. - Tricuspid valve: Moderate regurgitation. - Pulmonary arteries: Systolic pressure was mildly to moderately increased, estimated to be 50mm Hg. - Impressions: The RV is dilated on echo with evidence of mild to moderate pulmonary HTN. Given h/o syncope would suggest CT chest to exclude PE. If this is negative, consider RHC to further evaluate for presnce of pulmonary HTN. Impressions:  - The RV is dilated on echo with evidence of mild to moderate pulmonary HTN. Given h/o syncope would suggest CT chest to exclude PE. If this is negative, consider RHC to further evaluate for presnce of pulmonary HTN.  CT chest 01/13/12:  Findings: Normally opacified pulmonary arteries with no pulmonary  arterial filling defects seen. Minimal linear atelectasis or  scarring in the lingula and left lower lobe. Mild biapical pleural  thickening. No lung masses or enlarged lymph nodes. The included  portion of the  upper abdomen is unremarkable. Minimal thoracic  spine degenerative change.  IMPRESSION:  No pulmonary emboli or acute abnormality.   ASSESSMENT AND PLAN:  1. Pre-syncope: Echo with normal LV function but with mild to moderate RV dilatation with suggestion of elevated PA pressures, moderate TR.  CTA chest without evidence of PE. Will arrange right heart cath today to assess PA pressures at [facility]. Risks and benefits reviewed with pt.  Will need an outpatient event monitor. I will have our office call her to come by and pick this up. I have also given her our office number to call if she does not receive a call from [place]. She can probably be discharged home today after her procedure. She will be instructed to liberalize  her salt and fluid intake.   Connie Peterson  6/20/20137:11 AM

## 2012-01-14 NOTE — CV Procedure (Signed)
Right Heart Catheterization:  Indication: Elevated PA pressure by echo Clincial syncope Meds:  Versed 2 mg  Procedure:  Standard right heart catheterization was done from the right femoral vein using a 7Fr sheath and balloon flotation catheter.    Mean RA: 4  mmHg RV: 30/3  mmHg RPA: 22/8  mmHg with a mean of 17 mmHg LPA: 23/9 mmHg with a mean of 15 mmHg PCWP: 9 mmHg   Fick Cardiac Output:  3.98 L/minute,  2.2 L/minute/M2  Calculated PVR in normal range at 161 dynes-sec/cm-5  Impression:  Syncope clinically sounded vasovagal.  Reviewed echo and RV does appear enlarged.  ? Related to rheumatoid lung disease.  No PE on CT And PA pressures normal.  If recurrent syncope consider cardiac MRI to R/O RV dysplasia.  Tolerated right heart cath well with no hematoma  Connie Peterson 9:54 AM 01/14/2012

## 2012-01-14 NOTE — Care Management Note (Signed)
    Page 1 of 2   01/14/2012     3:40:10 PM   CARE MANAGEMENT NOTE 01/14/2012  Patient:  ROSALENA, MCCORRY   Account Number:  [id number]  Date Initiated:  01/13/2012  Documentation initiated by:  Colleen Can  Subjective/Objective Assessment:   dx syncope     Action/Plan:   Home upon discharge   Anticipated DC Date:  01/13/2012   Anticipated DC Plan:  HOME/SELF CARE  In-house referral  NA      DC Planning Services  CM consult      PAC Choice  NA   Choice offered to / List presented to:  NA   DME arranged  NA      DME agency  NA     HH arranged  NA      HH agency  NA   Status of service:  Completed, signed off Medicare Important Message given?  NO (If response is "NO", the following Medicare IM given date fields will be blank) Date Medicare IM given:   Date Additional Medicare IM given:    Discharge Disposition:    Per UR Regulation:  Reviewed for med. necessity/level of care/duration of stay  If discussed at Long Length of Stay Meetings, dates discussed:    Comments:  01/14/2012 Raynelle Bring BSN CCM [phone] Pt disscharged to home /no hh needs or orders.

## 2012-01-14 NOTE — Discharge Summary (Addendum)
[facility]                                                                                   Connie Peterson, is a 61 y.o. female  DOB 01/23/1951  MRN [id number].  Admission date:  01/12/2012  Discharge Date:  01/14/2012  Primary MD  No primary provider on file.  Admitting Physician  Earlean Polka, MD  Admission Diagnosis  Syncope [780.2] DIZZY chest pain  Discharge Diagnosis     Principal Problem:  *Syncope Active Problems:  Hyperlipidemia  HTN (hypertension)  GERD (gastroesophageal reflux disease)  RA (rheumatoid arthritis)    Past Medical History  Diagnosis Date  . Hypertension   . Hypercholesteremia   . Rheumatoid arthritis     Past Surgical History  Procedure Date  . Abdominal hysterectomy   . Neck surgery   . Parathyroidectomy   . Wrist ganglion excision      Hospital Course See H&P, Labs, Consult and Test reports for all details in brief, patient was admitted for couple episodes of syncope which could have been do to vasovagal event, patient was not orthostatic during her stay in the hospital, she ruled out for MI with stable cardiac enzymes, EKG was stable, she did have 5 beat run of nonsustained V. tach without any symptoms, echo was done which was unremarkable except that IV appear to be slightly enlarged following which she underwent a normal right heart catheterization. He was seen by cardiologist Dr. Dani Gobble, I discussed her case today with Dr. Ignacia Palma who okayed the patient to be discharged on her below given medications. She will get the event monitor as outpatient and will follow with her cardiologist as outpatient.   Of note patient has straight of rheumatoid arthritis and was recently started on Remicade, if her symptoms of syncope recur Remicade could be revisited as can cause dizziness in some people.   For her hypertension I have discontinued her HCTZ and lisinopril as when she arrived she was hypokalemic,  and there is a small possibility that her syncopal events could have been due to dehydration initially at home.    Incidental finding of grade 1 chronic diastolic dysfunction patient completely compensated CHF instructions provided outpatient cardiology followup.    Consults  Cardiology  Significant Tests:  See full reports for all details    Echo Study Conclusions  - Left ventricle: The cavity size was normal. Wall thickness was normal. Systolic function was normal. The estimated ejection fraction was in the range of 55% to 60%. Wall motion was normal; there were no regional wall motion abnormalities. Doppler parameters are consistent with abnormal left ventricular relaxation (grade 1 diastolic dysfunction). - Ventricular septum: Septal motion showed "bounce". - Right ventricle: The cavity size was mildly to moderately dilated. Wall thickness was normal. - Tricuspid valve: Moderate regurgitation. - Pulmonary arteries: Systolic pressure was mildly to moderately increased, estimated to be 50mm Hg. - Impressions: The RV is dilated on echo with evidence of mild to moderate pulmonary HTN. Given h/o syncope would suggest CT chest to exclude PE. If this is negative, consider RHC to further evaluate for presnce of pulmonary HTN.  Right Heart Catheterization:  Indication: Elevated PA pressure by echo Clincial syncope  Meds: Versed 2 mg  Procedure: Standard right heart catheterization was done from the right femoral vein using a 7Fr sheath and balloon flotation catheter.  Mean RA: 4 mmHg  RV: 30/3 mmHg  RPA: 22/8 mmHg with a mean of 17 mmHg  LPA: 23/9 mmHg with a mean of 15 mmHg  PCWP: 9 mmHg  Fick Cardiac Output: 3.98 L/minute, 2.2 L/minute/M2  Calculated PVR in normal range at 161 dynes-sec/cm-5  Impression: Syncope clinically sounded vasovagal. Reviewed echo and RV does appear enlarged. ? Related to rheumatoid lung disease. No PE on CT  And PA pressures normal. If  recurrent syncope consider cardiac MRI to R/O RV dysplasia. Tolerated right heart cath well with no hematoma   [name]  9:54 AM  01/14/2012   Dg Chest 2 View  01/12/2012  *RADIOLOGY REPORT*  Clinical Data: Multiple episodes of near syncope the past 2 days.  CHEST - 2 VIEW  Comparison: Two-view chest x-ray 05/02/1007.  Findings: Cardiomediastinal silhouette unremarkable, unchanged. Lungs clear.  Bronchovascular markings normal.  Pulmonary vascularity normal.  No pneumothorax.  No pleural effusions. Visualized bony thorax intact.  Prior C6-7 ACDF.  IMPRESSION: No acute cardiopulmonary disease.  Original Report Authenticated By: Arnell Sieving, M.D.   Ct Head Wo Contrast  01/12/2012  *RADIOLOGY REPORT*  Clinical Data: Multiple episodes of near syncope in the past 2 days.  CT HEAD WITHOUT CONTRAST  Technique:  Contiguous axial images were obtained from the base of the skull through the vertex without contrast.  Comparison: None.  Findings: Ventricular system normal in size and appearance for age. No mass lesion.  No midline shift.  No acute hemorrhage or hematoma.  No extra-axial fluid collections.  No evidence of acute infarction.  No focal brain parenchymal abnormalities.  No focal osseous abnormalities involving the skull.  Visualized paranasal sinuses, mastoid air cells, and middle ear cavities well- aerated.  IMPRESSION: Normal examination.  Original Report Authenticated By: Arnell Sieving, M.D.   Ct Angio Chest W/cm &/or Wo Cm  01/13/2012  *RADIOLOGY REPORT*  Clinical Data: Syncope.  CT ANGIOGRAPHY CHEST  Technique:  Multidetector CT imaging of the chest using the standard protocol during bolus administration of intravenous contrast. Multiplanar reconstructed images including MIPs were obtained and reviewed to evaluate the vascular anatomy.  Contrast: OMNIPAQUE IOHEXOL 350 MG/ML SOLN  Comparison: Chest radiographs obtained yesterday.  Findings: Normally opacified pulmonary arteries  with no pulmonary arterial filling defects seen.  Minimal linear atelectasis or scarring in the lingula and left lower lobe.  Mild biapical pleural thickening.  No lung masses or enlarged lymph nodes.  The included portion of the upper abdomen is unremarkable.  Minimal thoracic spine degenerative change.  IMPRESSION: No pulmonary emboli or acute abnormality.  Original Report Authenticated By: Darrol Angel, M.D.     Today   Subjective:   Connie Peterson today has no headache,no chest abdominal pain,no new weakness tingling or numbness, feels much better wants to go home today.   Objective:   Blood pressure 134/79, pulse 69, temperature 97.6 F (36.4 C), temperature source Oral, resp. rate 18, height 5\' 3"  (1.6 m), weight 78.3 kg (172 lb 9.9 oz), SpO2 97.00%.  Intake/Output Summary (Last 24 hours) at 01/14/12 1248 Last data filed at 01/14/12 1225  Gross per 24 hour  Intake   1150 ml  Output   2000 ml  Net   -850 ml  Exam Awake Alert, Oriented *3, No new F.N deficits, Normal affect [place].AT,PERRAL Supple Neck,No JVD, No cervical lymphadenopathy appriciated.  Symmetrical Chest wall movement, Good air movement bilaterally, CTAB RRR,No Gallops,Rubs or new Murmurs, No Parasternal Heave +ve B.Sounds, Abd Soft, Non tender, No organomegaly appriciated, No rebound -guarding or rigidity. No Cyanosis, Clubbing or edema, No new Rash or bruise  Data Review    CBC w Diff: Lab Results  Component Value Date   WBC 5.9 01/13/2012   HGB 11.3* 01/13/2012   HCT 32.3* 01/13/2012   PLT 223 01/13/2012   LYMPHOPCT 33 01/12/2012   MONOPCT 10 01/12/2012   EOSPCT 2 01/12/2012   BASOPCT 0 01/12/2012    CMP: Lab Results  Component Value Date   NA 139 01/13/2012   K 4.2 01/14/2012   CL 106 01/13/2012   CO2 25 01/13/2012   BUN 13 01/13/2012   CREATININE 0.70 01/13/2012   PROT 7.8 01/12/2012   ALBUMIN 4.4 01/12/2012   BILITOT 0.5 01/12/2012   ALKPHOS 84 01/12/2012   AST 45* 01/12/2012   ALT 57* 01/12/2012   .   Discharge Instructions     Follow with Primary MD in 3 days   Get CBC, CMP, checked 3 days by Primary MD and again as instructed by your Primary MD.    Get Medicines reviewed and adjusted.  Please request your Prim.MD to go over all Hospital Tests and Procedure/Radiological results at the follow up, please get all Hospital records sent to your Prim MD by signing hospital release before you go home.  Activity: As tolerated with Full fall precautions use walker/cane & assistance as needed  Diet: Heart Healthy  Check your Weight same time everyday, if you gain over 2 pounds, or you develop in leg swelling, experience more shortness of breath or chest pain, call your Primary MD immediately. Follow Cardiac Low Salt Diet and 1.8 lit/day fluid restriction.  Disposition Home    If you experience worsening of your admission symptoms, develop shortness of breath, life threatening emergency, suicidal or homicidal thoughts you must seek medical attention immediately by calling 911 or calling your MD immediately  if symptoms less severe.  You Must read complete instructions/literature along with all the possible adverse reactions/side effects for all the Medicines you take and that have been prescribed to you. Take any new Medicines after you have completely understood and accpet all the possible adverse reactions/side effects.   Do not drive  until you have seen by Primary MD or a Cardiologist and advised to drive.  Do not drive when taking Pain medications.    Do not take more than prescribed Pain, Sleep and Anxiety Medications  Special Instructions: If you have smoked or chewed Tobacco  in the last 2 yrs please stop smoking, stop any regular Alcohol  and or any Recreational drug use.  Wear Seat belts while driving.   Discharge Medications    [place], [place]  Home Medication Instructions [id number]   Printed on:01/14/12 1248  Medication Information                     simvastatin (ZOCOR) 10 MG tablet Take 10 mg by mouth daily.           methotrexate (RHEUMATREX) 2.5 MG tablet Take 15 mg by mouth once a week. Every sundday.Marland KitchenMarland KitchenCaution:Chemotherapy. Protect from light.           inFLIXimab (REMICADE) 100 MG injection Inject 100 mg into the vein See admin instructions. Gets  infusion every 8 weeks           pantoprazole (PROTONIX) 40 MG tablet Take 40 mg by mouth daily.           folic acid (FOLVITE) 1 MG tablet Take 1 mg by mouth daily.           metoprolol (LOPRESSOR) 100 MG tablet Take 100 mg by mouth daily.              Total Time in preparing paper work, data evaluation and todays exam - 35 minutes  Leroy Sea M.D on 01/14/2012 at 12:48 PM  [facility] Office  [phone]

## 2012-01-14 NOTE — Interval H&P Note (Signed)
History and Physical Interval Note:  01/14/2012 9:04 AM  Connie Peterson  has presented today for surgery, with the diagnosis of chest pain  The various methods of treatment have been discussed with the patient and family. After consideration of risks, benefits and other options for treatment, the patient has consented to  Procedure(s) (LRB): RIGHT HEART CATH (N/A) as a surgical intervention .  The patient's history has been reviewed, patient examined, no change in status, stable for surgery.  I have reviewed the patients' chart and labs.  Questions were answered to the patient's satisfaction.     [name]  Right heart ordered by CM to assess for pulmonary HTN.  Syncope with elevated PA pressure suggested by echo

## 2012-01-14 NOTE — Discharge Instructions (Signed)
Follow with Primary MD in 3 days   Get CBC, CMP, checked 3 days by Primary MD and again as instructed by your Primary MD.    Get Medicines reviewed and adjusted.  Please request your Prim.MD to go over all Hospital Tests and Procedure/Radiological results at the follow up, please get all Hospital records sent to your Prim MD by signing hospital release before you go home.  Activity: As tolerated with Full fall precautions use walker/cane & assistance as needed  Diet: Heart Healthy  Check your Weight same time everyday, if you gain over 2 pounds, or you develop in leg swelling, experience more shortness of breath or chest pain, call your Primary MD immediately. Follow Cardiac Low Salt Diet and 1.8 lit/day fluid restriction.  Disposition Home    If you experience worsening of your admission symptoms, develop shortness of breath, life threatening emergency, suicidal or homicidal thoughts you must seek medical attention immediately by calling 911 or calling your MD immediately  if symptoms less severe.  You Must read complete instructions/literature along with all the possible adverse reactions/side effects for all the Medicines you take and that have been prescribed to you. Take any new Medicines after you have completely understood and accpet all the possible adverse reactions/side effects.   Do not drive  until you have seen by Primary MD or a Cardiologist and advised to drive.  Do not drive when taking Pain medications.    Do not take more than prescribed Pain, Sleep and Anxiety Medications  Special Instructions: If you have smoked or chewed Tobacco  in the last 2 yrs please stop smoking, stop any regular Alcohol  and or any Recreational drug use.  Wear Seat belts while driving.

## 2012-02-04 ENCOUNTER — Encounter: Payer: Self-pay | Admitting: *Deleted

## 2012-02-05 ENCOUNTER — Encounter: Payer: Self-pay | Admitting: Physician Assistant

## 2012-02-05 ENCOUNTER — Ambulatory Visit (INDEPENDENT_AMBULATORY_CARE_PROVIDER_SITE_OTHER): Payer: BC Managed Care – PPO | Admitting: Physician Assistant

## 2012-02-05 VITALS — BP 150/90 | HR 61 | Ht 62.0 in | Wt 174.4 lb

## 2012-02-05 DIAGNOSIS — I071 Rheumatic tricuspid insufficiency: Secondary | ICD-10-CM

## 2012-02-05 DIAGNOSIS — R55 Syncope and collapse: Secondary | ICD-10-CM

## 2012-02-05 DIAGNOSIS — I10 Essential (primary) hypertension: Secondary | ICD-10-CM

## 2012-02-05 DIAGNOSIS — I079 Rheumatic tricuspid valve disease, unspecified: Secondary | ICD-10-CM

## 2012-02-05 DIAGNOSIS — E785 Hyperlipidemia, unspecified: Secondary | ICD-10-CM

## 2012-02-05 MED ORDER — LOSARTAN POTASSIUM 50 MG PO TABS: 50.0000 mg | ORAL_TABLET | Freq: Every day | ORAL | Status: DC

## 2012-02-05 MED ORDER — METOPROLOL TARTRATE 100 MG PO TABS: 50.0000 mg | ORAL_TABLET | Freq: Two times a day (BID) | ORAL | Status: AC

## 2012-02-05 MED ORDER — LOSARTAN POTASSIUM 50 MG PO TABS: 50.0000 mg | ORAL_TABLET | Freq: Every day | ORAL | Status: AC

## 2012-02-05 NOTE — Progress Notes (Signed)
[address] [place], [place]  [address] Phone: [phone] Fax:  [phone]  Date:  02/05/2012   Name:  Connie Peterson   DOB:  1951/05/16   MRN:  [id number]  PCP:  No primary provider on file.  Primary Cardiologist:  Dr. Verne Carrow  Primary Electrophysiologist:  None    History of Present Illness: Connie Peterson is a 61 y.o. female who returns for post hospital follow up.  She has a hx of HTN, HL, RA.  She was admitted 6/18-6/20 with multiple episodes of near syncope and one true syncopal episode.  She notes nausea preceding her symptoms.  No illness.  She had recently started on Remicade for rheumatoid arthritis.  She was not orthostatic.  D-dimer was negative.  Echocardiogram 01/12/12: EF 55-60%, grade 1 diastolic dysfunction, ventricular septal motion showed bounce, mild to moderately dilated RV, normal RV wall thickness, moderate TR, PASP 50, mild to moderate pulmonary hypertension.  Carotid dopplers neg for ICA stenosis.  Chest CT was negative for pulmonary embolism.  Right heart catheterization 01/14/12: Mean RA 40, RV 30/3, RPA 22/8 with a mean of 17, LPA 23/9 with a mean of 15, PCWP 9, CO 3.98, calculated PVR in the normal range.  PA pressures were felt to be normal.  The patient was discharged on an event monitor.  It was felt that if she had recurrent syncope a cardiac MRI would need to be obtained to rule out RV dysplasia.  I just reviewed strips available from her event monitor.  Thus far, this demonstrates sinus rhythm without significant arrhythmia.  Doing well since d/c.  No further syncope.  No chest pain, dyspnea, orthopnea, PND, edema.  She is now off Losartan/HCT.  BP is running much higher.     Past Medical History  Diagnosis Date  . Hypertension   . Hypercholesteremia   . Rheumatoid arthritis   . Syncope and collapse 12/2011    Echo 01/12/12: EF 55-60%, grade 1 diast dysfxn, ventricular septal motion showed bounce, mild to mod  dilated RV, normal RV wall thickness, moderate TR, PASP 50, mild to mod pulmo HTN; Chest CT neg for pulmonary embolism;  RHC 01/14/12: Mean RA 40, RV 30/3, RPA 22/8 with a mean of 17, LPA 23/9 with a mean of 15, PCWP 9, CO 3.98, calc PVR normal;  Event monitor wihout arrhythmias  . Dizziness   . GERD (gastroesophageal reflux disease)     Current Outpatient Prescriptions  Medication Sig Dispense Refill  . folic acid (FOLVITE) 1 MG tablet Take 1 mg by mouth daily.      Marland Kitchen inFLIXimab (REMICADE) 100 MG injection Inject 100 mg into the vein See admin instructions. Gets infusion every 8 weeks      . methotrexate (RHEUMATREX) 2.5 MG tablet Take 15 mg by mouth once a week. Every sundday.Marland KitchenMarland KitchenCaution:Chemotherapy. Protect from light.      . metoprolol (LOPRESSOR) 100 MG tablet Take 100 mg by mouth daily.      . simvastatin (ZOCOR) 10 MG tablet Take 10 mg by mouth daily.        Allergies: No Known Allergies  History  Substance Use Topics  . Smoking status: Never Smoker   . Smokeless tobacco: Never Used  . Alcohol Use: No     ROS:  Please see the history of present illness.    All other systems reviewed and negative.   PHYSICAL EXAM: VS:  BP 150/90  Pulse 61  Ht 5\' 2"  (1.575  m)  Wt 174 lb 6.4 oz (79.107 kg)  BMI 31.90 kg/m2 Well nourished, well developed, in no acute distress HEENT: normal Neck: no JVD Endo: no thyromegaly Cardiac:  normal S1, S2; RRR; 2/6 systolic murmur along LSB Lungs:  clear to auscultation bilaterally, no wheezing, rhonchi or rales Abd: soft, nontender, no hepatomegaly Ext: no edema Skin: warm and dry Neuro:  CNs 2-12 intact, no focal abnormalities noted  EKG:  Sinus rhythm, heart rate 61, normal axis, RSR prime in V1, no acute changes, no change from prior.   ASSESSMENT AND PLAN:  1.  Syncope Likely vasovagal.  Workup thus far has been negative.  Discussed her case with Dr. Verne Carrow.  He will see her back in 6 mos.  She knows to return sooner if she  has recurrent syncope.    2.  Moderate tricuspid regurgitation Normal right heart pressures on RHC. Follow up in 6 mos as noted. Will likely need repeat echo in 6-12 mos.  3.  Hypertension Uncontrolled. Change metoprolol to 50 mg bid. Add losartan back at 50 mg QD. Check bmet in one week.  4.  Rheumatoid arthritis Followed by Dr. Dareen Piano. She is not reporting dyspnea. Not sure proceeding with PFTs is indicated at this time. She can d/w her rheumatologist if this is necessary.   Luna Glasgow, PA-C  12:59 PM 02/05/2012

## 2012-02-05 NOTE — Patient Instructions (Addendum)
Your physician wants you to follow-up in: 6 MONTH WITH DR. Clifton James. You will receive a reminder letter in the mail two months in advance. If you don't receive a letter, please call our office to schedule the follow-up appointment.   Your physician has recommended you make the following change in your medication: CHANGE METOPROLOL TO TAKING 50 MG TWICE DAILY; THIS WILL BE 1/2 TABLET OF THE 100 MG  START BACK ON LOSARTAN 50 MG DAILY  Your physician recommends that you return for lab work in: 02/12/12 BMET

## 2012-02-11 ENCOUNTER — Encounter: Payer: Self-pay | Admitting: Cardiovascular Disease

## 2012-03-02 ENCOUNTER — Telehealth: Payer: Self-pay | Admitting: *Deleted

## 2012-03-02 NOTE — Telephone Encounter (Signed)
I called pt to review monitor results. Left message to call back 

## 2012-03-16 ENCOUNTER — Ambulatory Visit (INDEPENDENT_AMBULATORY_CARE_PROVIDER_SITE_OTHER): Payer: BC Managed Care – PPO | Admitting: Internal Medicine

## 2012-03-16 ENCOUNTER — Encounter: Payer: Self-pay | Admitting: Internal Medicine

## 2012-03-16 VITALS — BP 128/72 | HR 64 | Temp 97.4°F | Resp 16 | Ht 62.0 in | Wt 174.0 lb

## 2012-03-16 DIAGNOSIS — L259 Unspecified contact dermatitis, unspecified cause: Secondary | ICD-10-CM

## 2012-03-16 DIAGNOSIS — L309 Dermatitis, unspecified: Secondary | ICD-10-CM

## 2012-03-16 DIAGNOSIS — R55 Syncope and collapse: Secondary | ICD-10-CM

## 2012-03-16 DIAGNOSIS — I079 Rheumatic tricuspid valve disease, unspecified: Secondary | ICD-10-CM

## 2012-03-16 DIAGNOSIS — I10 Essential (primary) hypertension: Secondary | ICD-10-CM

## 2012-03-16 DIAGNOSIS — M069 Rheumatoid arthritis, unspecified: Secondary | ICD-10-CM

## 2012-03-16 DIAGNOSIS — E785 Hyperlipidemia, unspecified: Secondary | ICD-10-CM

## 2012-03-16 DIAGNOSIS — K219 Gastro-esophageal reflux disease without esophagitis: Secondary | ICD-10-CM

## 2012-03-16 DIAGNOSIS — Z90710 Acquired absence of both cervix and uterus: Secondary | ICD-10-CM | POA: Insufficient documentation

## 2012-03-16 DIAGNOSIS — I071 Rheumatic tricuspid insufficiency: Secondary | ICD-10-CM

## 2012-03-16 MED ORDER — PREDNISONE 20 MG PO TABS: ORAL_TABLET | ORAL | Status: DC

## 2012-03-16 NOTE — Progress Notes (Signed)
Subjective:    Patient ID: Connie Peterson, female    DOB: 1951/03/04, 61 y.o.   MRN: [id number]  HPI New pt here for first visit.  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  PMH of RA on MTX and Remicade,  HTN, GERd, Hyperlipidemia,  S/P hysterectomy from Fibroids.  In 12/2011 pt had syncopal episode at work and was found to have TR evaluated by Dr. Sanjuana Kava.  She is concerned over reddened itchy rash that developed a few days after starting Remicade. She did discuss with her rheumatologist who recommended her to take Benadryl.  She takes Zyrtec in the day and Benadryl at night,  The NP at her place of employment gave her TAC creme which helps a little  No Known Allergies Past Medical History  Diagnosis Date  . Hypertension   . Hypercholesteremia   . Rheumatoid arthritis   . Syncope and collapse 12/2011    Echo 01/12/12: EF 55-60%, grade 1 diast dysfxn, ventricular septal motion showed bounce, mild to mod dilated RV, normal RV wall thickness, moderate TR, PASP 50, mild to mod pulmo HTN; Chest CT neg for pulmonary embolism;  RHC 01/14/12: Mean RA 40, RV 30/3, RPA 22/8 with a mean of 17, LPA 23/9 with a mean of 15, PCWP 9, CO 3.98, calc PVR normal;  Event monitor wihout arrhythmias  . Dizziness   . GERD (gastroesophageal reflux disease)    Past Surgical History  Procedure Date  . Abdominal hysterectomy   . Neck surgery   . Parathyroidectomy   . Wrist ganglion excision   . Cardiac catheterization     normal  . Cesarean section    History   Social History  . Marital Status: Married    Spouse Name: N/A    Number of Children: N/A  . Years of Education: N/A   Occupational History  . Not on file.   Social History Main Topics  . Smoking status: Never Smoker   . Smokeless tobacco: Never Used  . Alcohol Use: No  . Drug Use: No  . Sexually Active: No   Other Topics Concern  . Not on file   Social History Narrative  . No narrative on file   Family History   Problem Relation Age of Onset  . Heart failure Mother   . Rheum arthritis Mother    Patient Active Problem List  Diagnosis  . Syncope  . Hyperlipidemia  . HTN (hypertension)  . GERD (gastroesophageal reflux disease)  . RA (rheumatoid arthritis)  . Tricuspid regurgitation  . S/P hysterectomy   Current Outpatient Prescriptions on File Prior to Visit  Medication Sig Dispense Refill  . folic acid (FOLVITE) 1 MG tablet Take 1 mg by mouth daily.      Marland Kitchen inFLIXimab (REMICADE) 100 MG injection Inject 100 mg into the vein See admin instructions. Gets infusion every 8 weeks      . losartan (COZAAR) 50 MG tablet Take 1 tablet (50 mg total) by mouth daily.  30 tablet  11  . methotrexate (RHEUMATREX) 2.5 MG tablet Take 15 mg by mouth once a week. Every sundday.Marland KitchenMarland KitchenCaution:Chemotherapy. Protect from light.      . metoprolol (LOPRESSOR) 100 MG tablet Take 0.5 tablets (50 mg total) by mouth 2 (two) times daily.  60 tablet  11  . simvastatin (ZOCOR) 10 MG tablet Take 10 mg by mouth daily.           Review of Systems See HPI  Objective:   Physical Exam Physical Exam  Nursing note and vitals reviewed.  Constitutional: She is oriented to person, place, and time. She appears well-developed and well-nourished.  HENT:  Head: Normocephalic and atraumatic.  Cardiovascular: Normal rate and regular rhythm. Exam reveals no gallop and no friction rub.  No murmur heard.  Pulmonary/Chest: Breath sounds normal. She has no wheezes. She has no rales.  Neurological: She is alert and oriented to person, place, and time.  Skin: Skin is warm and dry. Maculopapular lesions over wrist, arms and back.  A few small pustules Psychiatric: She has a normal mood and affect. Her behavior is normal.              Assessment & Plan:  Skin rash  Possible med related to remicade.  Will refer to derm and give oral predniusone 40 mg taper  HTN  Continue current meds  Hyperlipidemia continue current  meds  GERD  RA    Syncope ,  TR encouraged to keep follow up with Dr. Sanjuana Kava in 6 months  S/P Hysterectomy  Schedule  CPE

## 2012-03-16 NOTE — Patient Instructions (Addendum)
Schedule cpe  Labs to be done at employment

## 2012-03-25 NOTE — Telephone Encounter (Signed)
Left message to call back  

## 2012-04-04 NOTE — Telephone Encounter (Signed)
Left message on home and cell numbers to call back 

## 2012-04-08 ENCOUNTER — Encounter: Payer: Self-pay | Admitting: *Deleted

## 2012-04-08 NOTE — Telephone Encounter (Signed)
Letter sent asking pt to contact office to review results.  

## 2012-04-13 ENCOUNTER — Telehealth: Payer: Self-pay | Admitting: Internal Medicine

## 2012-04-13 DIAGNOSIS — E559 Vitamin D deficiency, unspecified: Secondary | ICD-10-CM | POA: Insufficient documentation

## 2012-04-13 NOTE — Telephone Encounter (Signed)
Connie Peterson   Call this pt and let her know that her labs done at [facility] showed her vitamin D is a little low.  Counsel her to take 2000 units of Vitamin D daily

## 2012-04-14 ENCOUNTER — Telehealth: Payer: Self-pay | Admitting: *Deleted

## 2012-04-14 NOTE — Telephone Encounter (Signed)
Pt called and advised to take Vit D per Dr Constance Goltz

## 2012-04-15 ENCOUNTER — Telehealth: Payer: Self-pay | Admitting: *Deleted

## 2012-04-15 NOTE — Telephone Encounter (Signed)
Pt son called and appt made for 04/15/12 at 1530 for doppler study

## 2012-04-18 ENCOUNTER — Telehealth: Payer: Self-pay | Admitting: Cardiovascular Disease

## 2012-04-18 NOTE — Telephone Encounter (Signed)
Call patient back at # [phone].

## 2012-04-18 NOTE — Telephone Encounter (Signed)
Pt rtn pat's call from Friday, pls call [phone]

## 2012-04-18 NOTE — Telephone Encounter (Signed)
Spoke to patient she said she saw Tereso Newcomer PA 02/05/12 and he told her her monitor was ok.Message sent to Dr Gibson Ramp nurse.

## 2012-04-19 NOTE — Telephone Encounter (Signed)
Pt aware of results 

## 2012-04-19 NOTE — Telephone Encounter (Signed)
Spoke with pt. She is aware of monitor results and has no questions.

## 2012-05-13 ENCOUNTER — Encounter: Payer: Self-pay | Admitting: *Deleted

## 2012-06-27 ENCOUNTER — Telehealth: Payer: Self-pay | Admitting: *Deleted

## 2012-06-27 NOTE — Telephone Encounter (Signed)
Medical records contacted regarding recent mm results awaiting fax

## 2012-06-28 ENCOUNTER — Encounter: Payer: Self-pay | Admitting: Internal Medicine

## 2012-06-28 ENCOUNTER — Ambulatory Visit (INDEPENDENT_AMBULATORY_CARE_PROVIDER_SITE_OTHER): Payer: BC Managed Care – PPO | Admitting: Internal Medicine

## 2012-06-28 VITALS — BP 115/72 | HR 64 | Temp 97.2°F | Resp 18 | Ht 62.0 in | Wt 176.0 lb

## 2012-06-28 DIAGNOSIS — Z23 Encounter for immunization: Secondary | ICD-10-CM

## 2012-06-28 DIAGNOSIS — Z0000 Encounter for general adult medical examination without abnormal findings: Secondary | ICD-10-CM

## 2012-06-28 DIAGNOSIS — K219 Gastro-esophageal reflux disease without esophagitis: Secondary | ICD-10-CM

## 2012-06-28 DIAGNOSIS — I10 Essential (primary) hypertension: Secondary | ICD-10-CM

## 2012-06-28 DIAGNOSIS — M069 Rheumatoid arthritis, unspecified: Secondary | ICD-10-CM

## 2012-06-28 DIAGNOSIS — E559 Vitamin D deficiency, unspecified: Secondary | ICD-10-CM

## 2012-06-28 DIAGNOSIS — E785 Hyperlipidemia, unspecified: Secondary | ICD-10-CM

## 2012-06-28 LAB — POCT URINALYSIS DIPSTICK
Blood, UA: NEGATIVE
Ketones, UA: NEGATIVE
Protein, UA: NEGATIVE
Spec Grav, UA: <=1.005
Urobilinogen, UA: NEGATIVE

## 2012-06-28 LAB — HEMOCCULT GUIAC POC 1CARD (OFFICE): Fecal Occult Blood, POC: NEGATIVE

## 2012-06-28 NOTE — Addendum Note (Signed)
Addended by: Perry Mount L on: 06/28/2012 11:40 AM   Modules accepted: Orders

## 2012-06-28 NOTE — Patient Instructions (Addendum)
See me as needed 

## 2012-06-28 NOTE — Progress Notes (Signed)
Subjective:    Patient ID: Connie Peterson, female    DOB: 1951/05/30, 61 y.o.   MRN: [id number]  HPI Connie Peterson is here for comprehensive eval.  She had seen Dr. Emily Filbert and was treated for scabes.  RAsh on skin is gone  No chest pain or pressure.    She is getting better about taking vitamin D and her calcium  No Known Allergies Past Medical History  Diagnosis Date  . Hypertension   . Hypercholesteremia   . Rheumatoid arthritis   . Syncope and collapse 12/2011    Echo 01/12/12: EF 55-60%, grade 1 diast dysfxn, ventricular septal motion showed bounce, mild to mod dilated RV, normal RV wall thickness, moderate TR, PASP 50, mild to mod pulmo HTN; Chest CT neg for pulmonary embolism;  RHC 01/14/12: Mean RA 40, RV 30/3, RPA 22/8 with a mean of 17, LPA 23/9 with a mean of 15, PCWP 9, CO 3.98, calc PVR normal;  Event monitor wihout arrhythmias  . Dizziness   . GERD (gastroesophageal reflux disease)    Past Surgical History  Procedure Date  . Abdominal hysterectomy   . Neck surgery   . Parathyroidectomy   . Wrist ganglion excision   . Cardiac catheterization     normal  . Cesarean section    History   Social History  . Marital Status: Married    Spouse Name: N/A    Number of Children: N/A  . Years of Education: N/A   Occupational History  . Not on file.   Social History Main Topics  . Smoking status: Never Smoker   . Smokeless tobacco: Never Used  . Alcohol Use: No  . Drug Use: No  . Sexually Active: No   Other Topics Concern  . Not on file   Social History Narrative  . No narrative on file   Family History  Problem Relation Age of Onset  . Heart failure Mother   . Rheum arthritis Mother    Patient Active Problem List  Diagnosis  . Syncope  . Hyperlipidemia  . HTN (hypertension)  . GERD (gastroesophageal reflux disease)  . RA (rheumatoid arthritis)  . Tricuspid regurgitation  . S/P hysterectomy  . Vitamin D deficiency   Current Outpatient Prescriptions on  File Prior to Visit  Medication Sig Dispense Refill  . folic acid (FOLVITE) 1 MG tablet Take 1 mg by mouth daily.      Marland Kitchen inFLIXimab (REMICADE) 100 MG injection Inject 100 mg into the vein See admin instructions. Gets infusion every 8 weeks      . losartan (COZAAR) 50 MG tablet Take 1 tablet (50 mg total) by mouth daily.  30 tablet  11  . methotrexate (RHEUMATREX) 2.5 MG tablet Take 15 mg by mouth once a week. Every sundday.Marland KitchenMarland KitchenCaution:Chemotherapy. Protect from light.      . metoprolol (LOPRESSOR) 100 MG tablet Take 0.5 tablets (50 mg total) by mouth 2 (two) times daily.  60 tablet  11  . simvastatin (ZOCOR) 10 MG tablet Take 10 mg by mouth daily.           Review of Systems See HPI    Objective:   Physical Exam  Physical Exam  Nursing note and vitals reviewed.  Constitutional: She is oriented to person, place, and time. She appears well-developed and well-nourished.  HENT:  Head: Normocephalic and atraumatic.  Right Ear: Tympanic membrane and ear canal normal. No drainage. Tympanic membrane is not injected and not erythematous.  Left Ear: Tympanic membrane  and ear canal normal. No drainage. Tympanic membrane is not injected and not erythematous.  Nose: Nose normal. Right sinus exhibits no maxillary sinus tenderness and no frontal sinus tenderness. Left sinus exhibits no maxillary sinus tenderness and no frontal sinus tenderness.  Mouth/Throat: Oropharynx is clear and moist. No oral lesions. No oropharyngeal exudate.  Eyes: Conjunctivae and EOM are normal. Pupils are equal, round, and reactive to light.  Neck: Normal range of motion. Neck supple. No JVD present. Carotid bruit is not present. No mass and no thyromegaly present.  Cardiovascular: Normal rate, regular rhythm, S1 normal, S2 normal and intact distal pulses. Exam reveals no gallop and no friction rub.  No murmur heard.  Pulses:  Carotid pulses are 2+ on the right side, and 2+ on the left side.  Dorsalis pedis pulses are 2+  on the right side, and 2+ on the left side.  No carotid bruit. No LE edema  Pulmonary/Chest: Breath sounds normal. She has no wheezes. She has no rales. She exhibits no tenderness.  Breast  No discrete masses no nipple discharge no axillary adenopathy Abdominal: Soft. Bowel sounds are normal. She exhibits no distension and no mass. There is no hepatosplenomegaly. There is no tenderness. There is no CVA tenderness. Rectal no mass guaiac neg Musculoskeletal: Normal range of motion.  No active synovitis to joints.  Lymphadenopathy:  She has no cervical adenopathy.  She has no axillary adenopathy.  Right: No inguinal and no supraclavicular adenopathy present.  Left: No inguinal and no supraclavicular adenopathy present.  Neurological: She is alert and oriented to person, place, and time. She has normal strength and normal reflexes. She displays no tremor. No cranial nerve deficit or sensory deficit. Coordination and gait normal.  Skin: Skin is warm and dry. No rash noted. No cyanosis. Nails show no clubbing.  Psychiatric: She has a normal mood and affect. Her speech is normal and behavior is normal. Cognition and memory are normal.          Assessment & Plan:  HTN adequately controlled.  Continue current meds  Hyperlipidemia  Continue current meds  Genella Rife  Continue current meds  RA on remicade  I counseled pt to discuss pneumonia vaccine with her rheumatologist  Syncope  Managed by cardiology.  See me as needed

## 2012-06-29 ENCOUNTER — Encounter: Payer: BC Managed Care – PPO | Admitting: Internal Medicine

## 2012-06-29 ENCOUNTER — Encounter: Payer: Self-pay | Admitting: *Deleted

## 2012-07-06 ENCOUNTER — Encounter: Payer: Self-pay | Admitting: Internal Medicine

## 2012-07-06 DIAGNOSIS — Z9289 Personal history of other medical treatment: Secondary | ICD-10-CM | POA: Insufficient documentation

## 2012-07-12 ENCOUNTER — Encounter: Payer: Self-pay | Admitting: *Deleted

## 2013-06-06 ENCOUNTER — Encounter: Payer: Self-pay | Admitting: *Deleted

## 2014-01-30 IMAGING — CR DG CHEST 2V
2 series · 2 of 2 positions shown · non-contrast
Comparison: Two-view chest x-ray [DATE].

CLINICAL DATA: Multiple episodes of near syncope the past 2 days.

CHEST - 2 VIEW

[w chest pa]
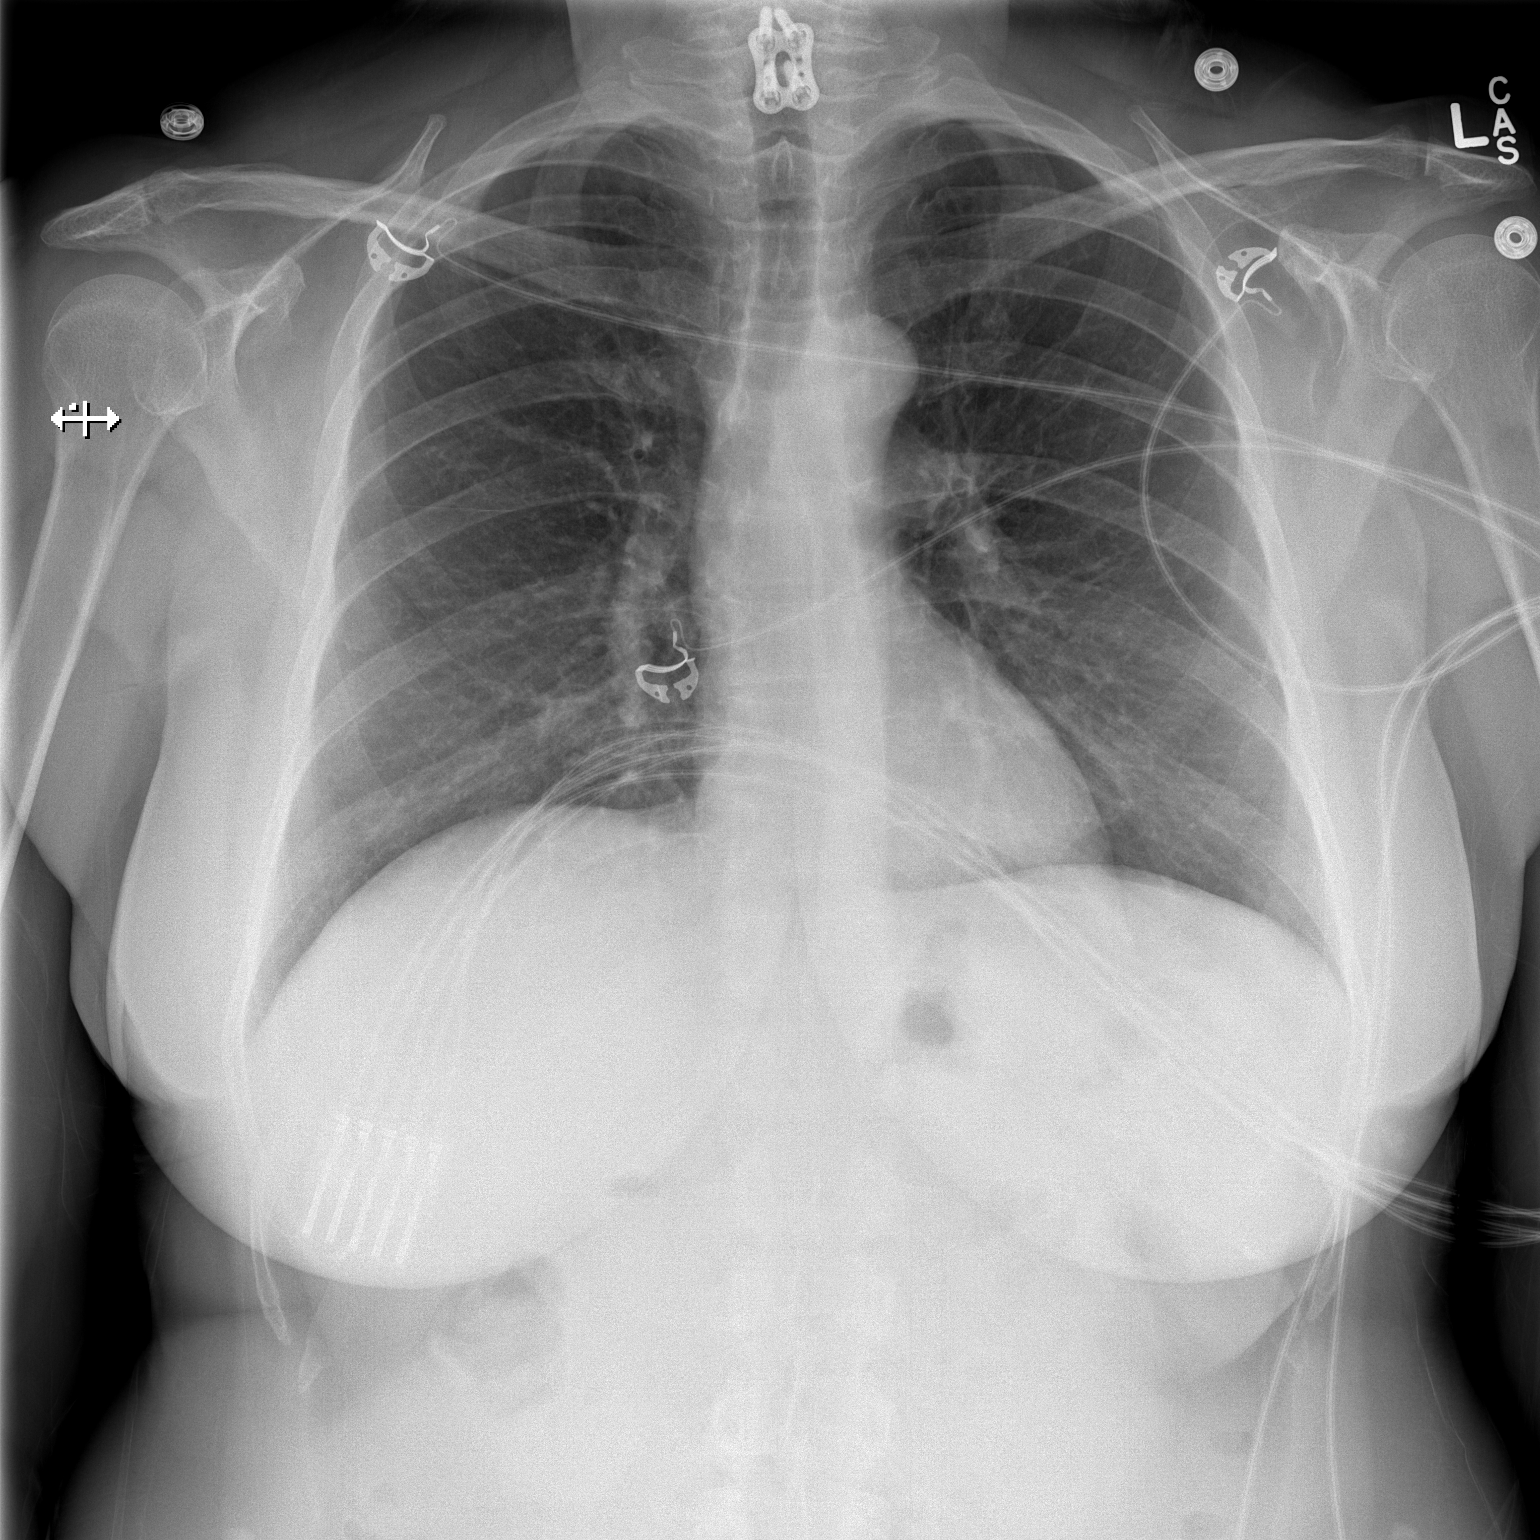

[w chest lat]
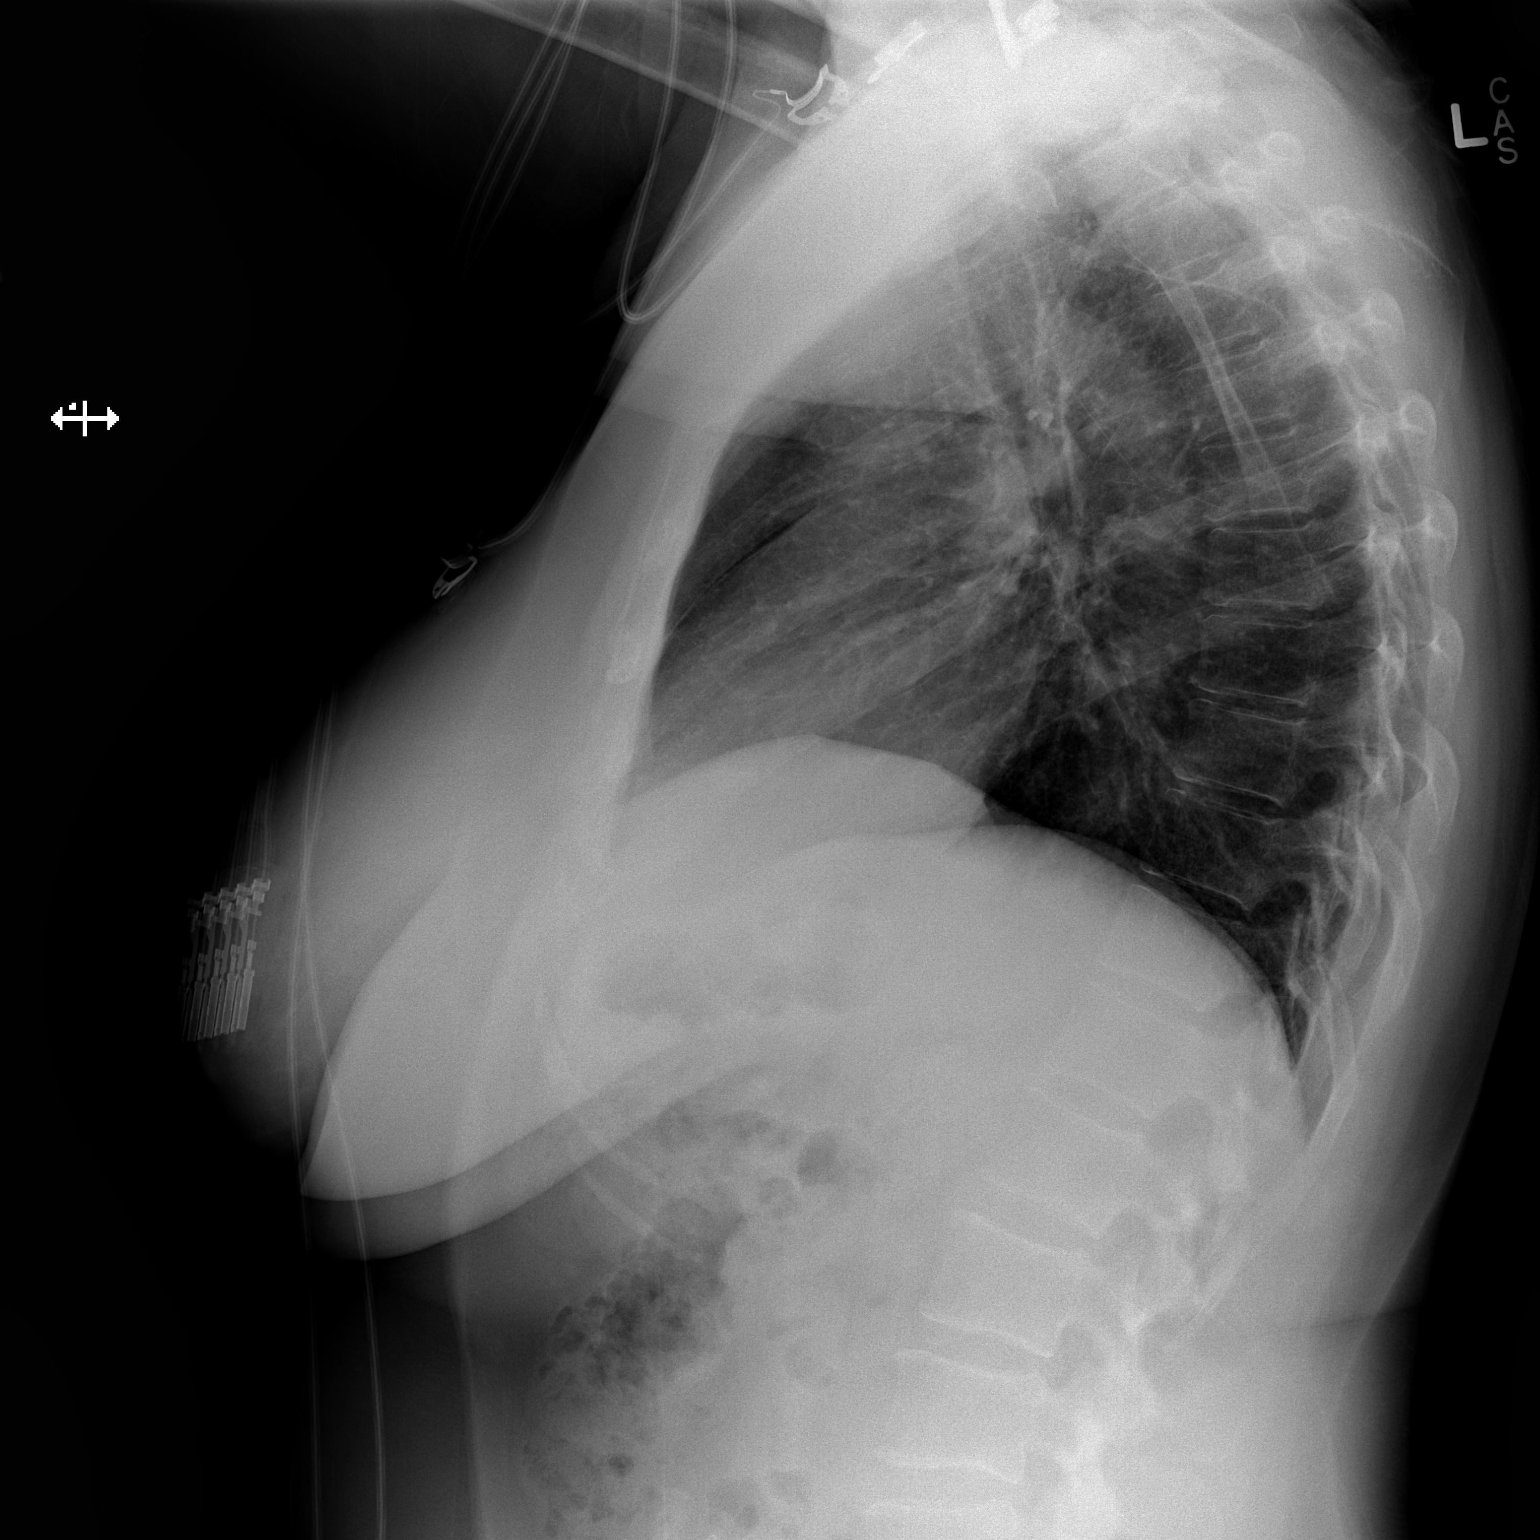

[2 of 2 positions shown; findings below may reference images not displayed]

FINDINGS: Cardiomediastinal silhouette unremarkable, unchanged.
Lungs clear.  Bronchovascular markings normal.  Pulmonary
vascularity normal.  No pneumothorax.  No pleural effusions.
Visualized bony thorax intact.  Prior C6-7 ACDF.
IMPRESSION: No acute cardiopulmonary disease.

## 2014-01-30 IMAGING — CT CT HEAD W/O CM
2 series · 16 of 30 positions shown, 20 images · non-contrast
Comparison: None.

CLINICAL DATA: Multiple episodes of near syncope in the past 2
days.

CT HEAD WITHOUT CONTRAST
TECHNIQUE: Contiguous axial images were obtained from the base of
the skull through the vertex without contrast.

[Series 2: head w/o · axial · non-contrast · 0.43mm/px · z∈[+271,+386]mm · 13 of 27 slices shown, 17 images]
[im 2/27  brain]
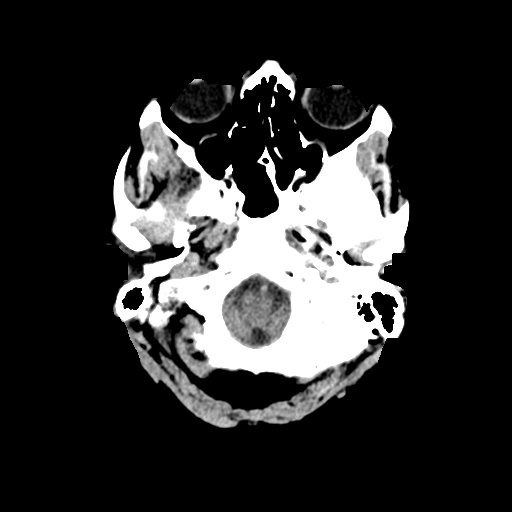
[im 2/27  bone]
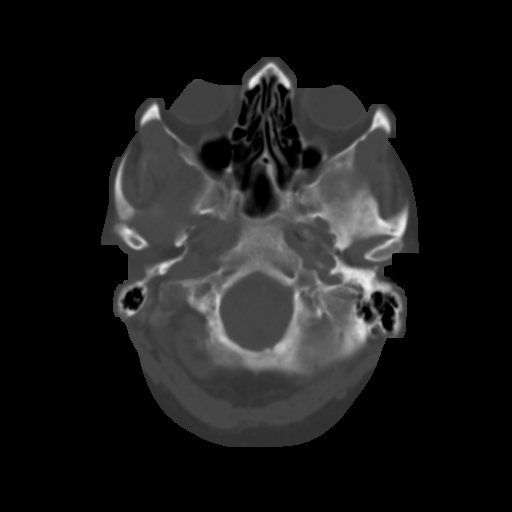
[im 4/27  brain]
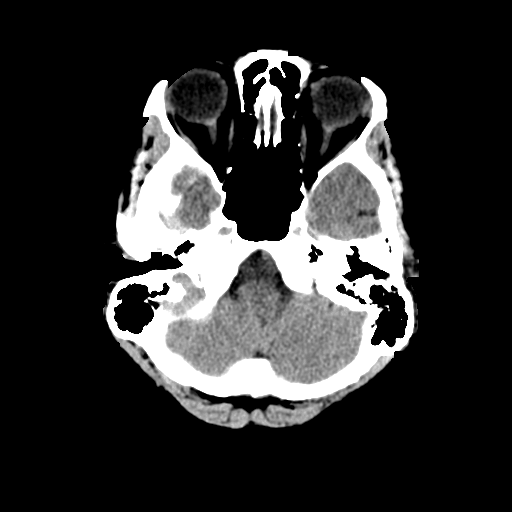
[im 6/27  brain]
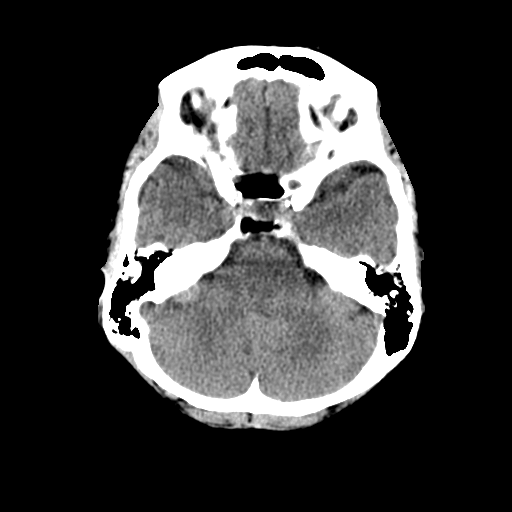
[im 8/27  brain]
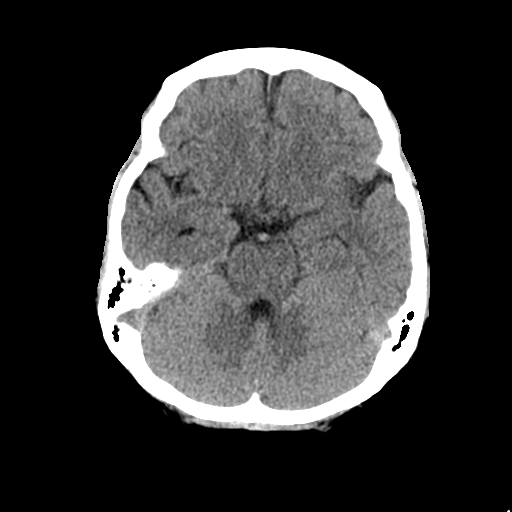
[im 10/27  brain]
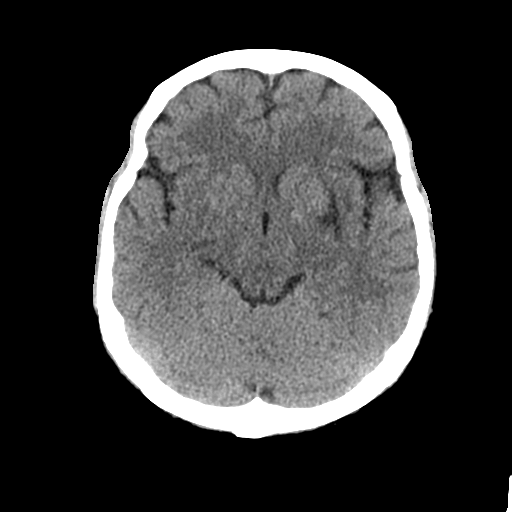
[im 10/27  bone]
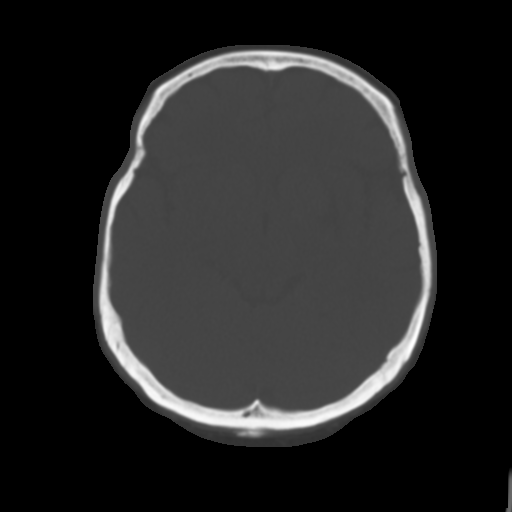
[im 12/27  brain]
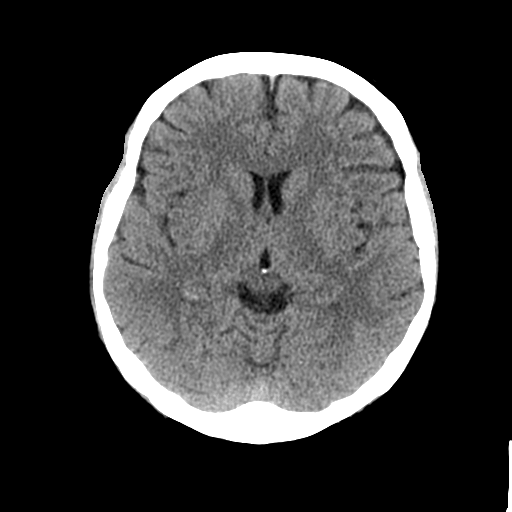
[im 14/27  brain]
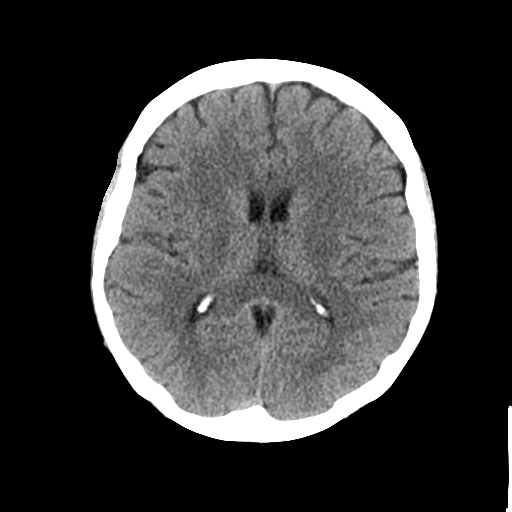
[im 15/27  brain]
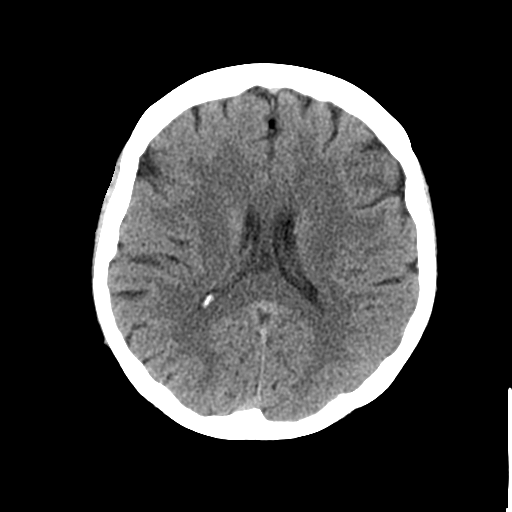
[im 17/27  brain]
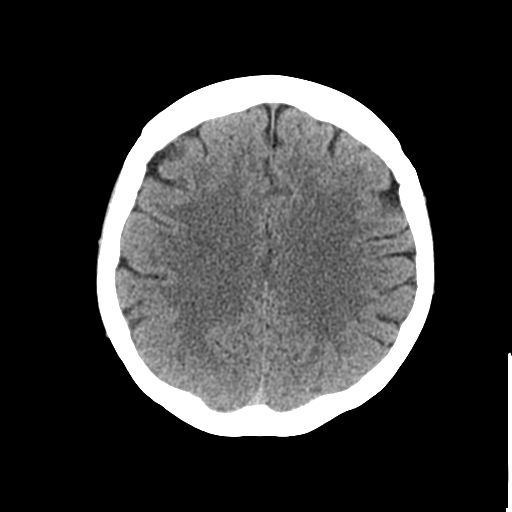
[im 17/27  bone]
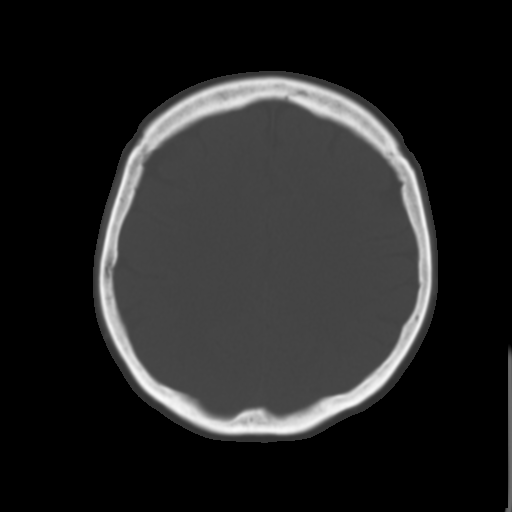
[im 19/27  brain]
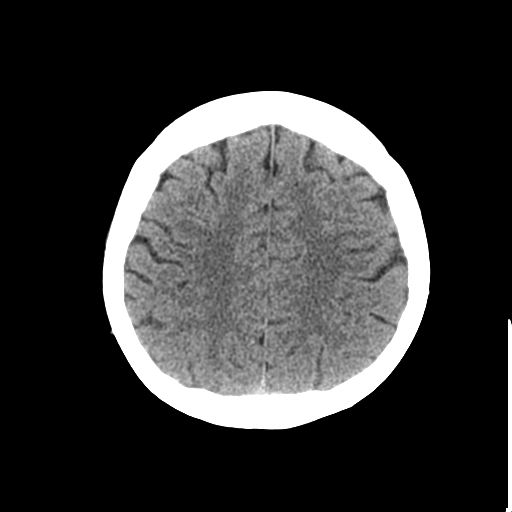
[im 21/27  brain]
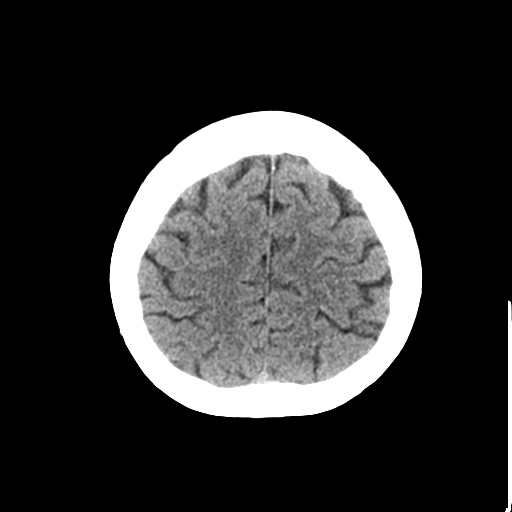
[im 23/27  brain]
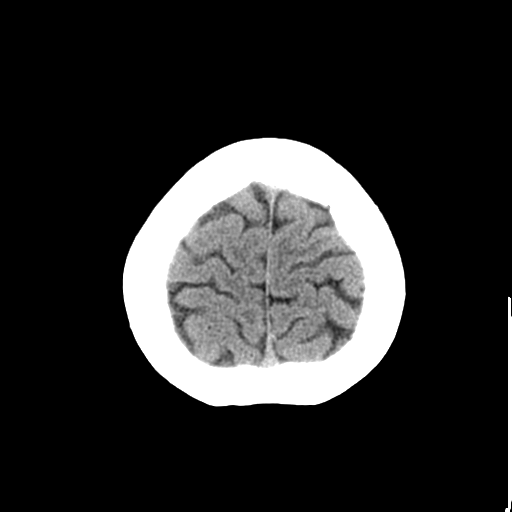
[im 25/27  brain]
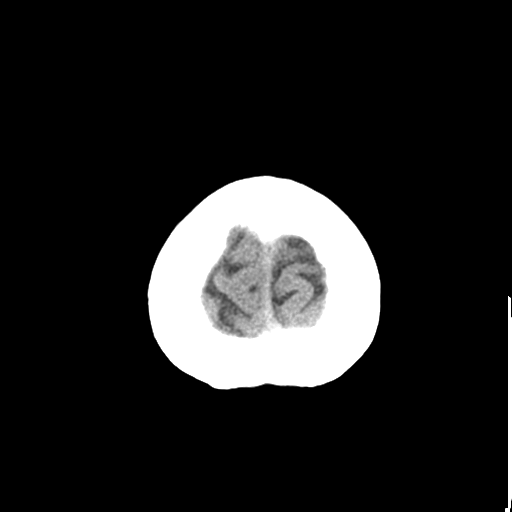
[im 25/27  bone]
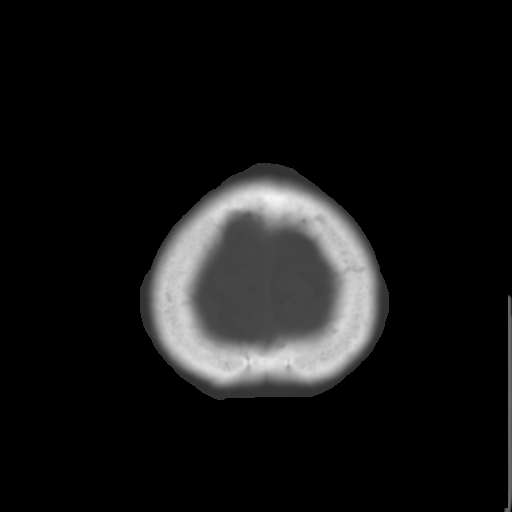

[Series 3: bone windows · axial · 0.43mm/px · z∈[+271,+311]mm · 3 of 27 slices shown]
[im 2/27  bone]
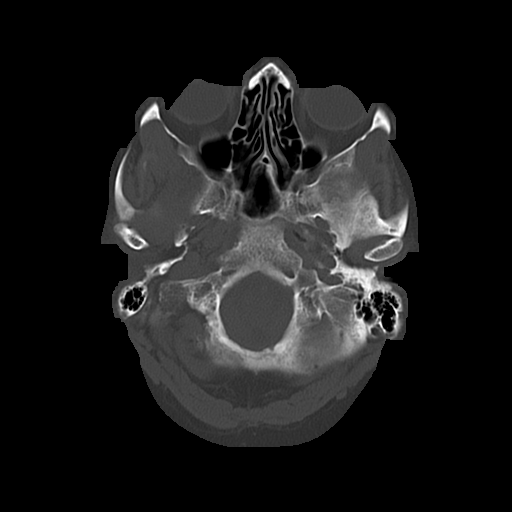
[im 6/27  bone]
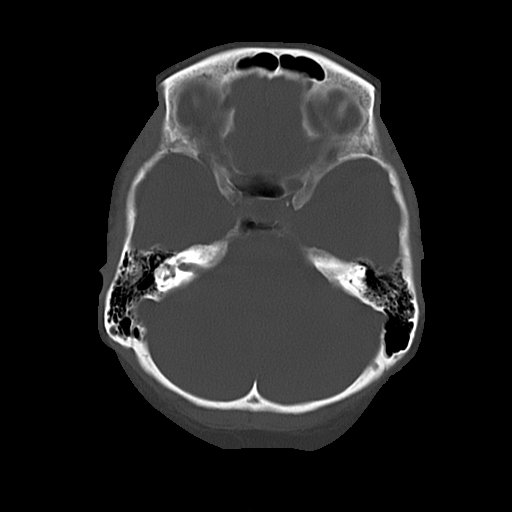
[im 10/27  bone]
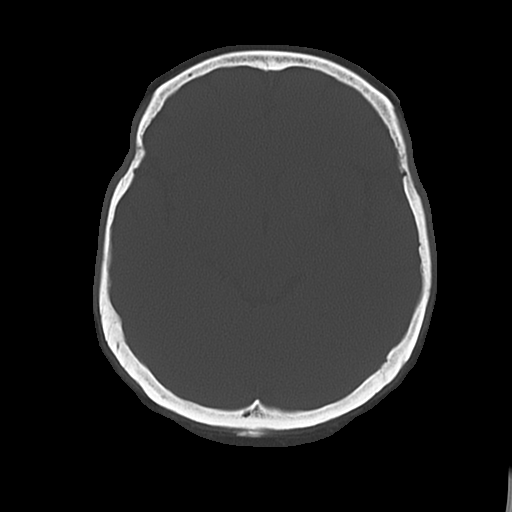

[16 of 30 positions shown; findings below may reference images not displayed]

FINDINGS: Ventricular system normal in size and appearance for age.
No mass lesion.  No midline shift.  No acute hemorrhage or
hematoma.  No extra-axial fluid collections.  No evidence of acute
infarction.  No focal brain parenchymal abnormalities.

No focal osseous abnormalities involving the skull.  Visualized
paranasal sinuses, mastoid air cells, and middle ear cavities well-
aerated.
IMPRESSION: Normal examination.

## 2014-05-28 ENCOUNTER — Encounter: Payer: Self-pay | Admitting: Internal Medicine

## 2014-07-05 ENCOUNTER — Encounter (HOSPITAL_COMMUNITY): Payer: Self-pay | Admitting: Cardiovascular Disease

## 2014-11-12 ENCOUNTER — Encounter: Payer: Self-pay | Admitting: *Deleted

## 2014-11-26 ENCOUNTER — Encounter: Payer: Self-pay | Admitting: *Deleted

## 2022-12-09 ENCOUNTER — Encounter (HOSPITAL_BASED_OUTPATIENT_CLINIC_OR_DEPARTMENT_OTHER): Payer: Self-pay | Admitting: Radiology

## 2022-12-09 ENCOUNTER — Other Ambulatory Visit: Payer: Self-pay

## 2022-12-09 ENCOUNTER — Emergency Department (HOSPITAL_BASED_OUTPATIENT_CLINIC_OR_DEPARTMENT_OTHER): Payer: Medicare Other

## 2022-12-09 ENCOUNTER — Other Ambulatory Visit (HOSPITAL_BASED_OUTPATIENT_CLINIC_OR_DEPARTMENT_OTHER): Payer: Self-pay

## 2022-12-09 ENCOUNTER — Emergency Department (HOSPITAL_BASED_OUTPATIENT_CLINIC_OR_DEPARTMENT_OTHER)
Admission: EM | Admit: 2022-12-09 | Discharge: 2022-12-09 | Disposition: A | Discharge status: Home / Self Care | Payer: Medicare Other | Attending: Emergency Medicine | Admitting: Emergency Medicine

## 2022-12-09 DIAGNOSIS — Z79899 Other long term (current) drug therapy: Secondary | ICD-10-CM | POA: Diagnosis not present

## 2022-12-09 DIAGNOSIS — R197 Diarrhea, unspecified: Secondary | ICD-10-CM | POA: Diagnosis not present

## 2022-12-09 DIAGNOSIS — R112 Nausea with vomiting, unspecified: Secondary | ICD-10-CM | POA: Diagnosis not present

## 2022-12-09 DIAGNOSIS — I10 Essential (primary) hypertension: Secondary | ICD-10-CM | POA: Insufficient documentation

## 2022-12-09 DIAGNOSIS — R1013 Epigastric pain: Secondary | ICD-10-CM | POA: Insufficient documentation

## 2022-12-09 LAB — CBC
HCT: 35 % — ABNORMAL LOW (ref 36.0–46.0)
Hemoglobin: 12.2 g/dL (ref 12.0–15.0)
MCH: 31.4 pg (ref 26.0–34.0)
MCHC: 34.9 g/dL (ref 30.0–36.0)
MCV: 90.2 fL (ref 80.0–100.0)
Platelets: 325 10*3/uL (ref 150–400)
RBC: 3.88 MIL/uL (ref 3.87–5.11)
RDW: 13.4 % (ref 11.5–15.5)
WBC: 9.5 10*3/uL (ref 4.0–10.5)
nRBC: 0 % (ref 0.0–0.2)

## 2022-12-09 LAB — COMPREHENSIVE METABOLIC PANEL
ALT: 17 U/L (ref 0–44)
AST: 19 U/L (ref 15–41)
Albumin: 4.6 g/dL (ref 3.5–5.0)
Alkaline Phosphatase: 55 U/L (ref 38–126)
Anion gap: 9 (ref 5–15)
BUN: 14 mg/dL (ref 8–23)
CO2: 25 mmol/L (ref 22–32)
Calcium: 10.2 mg/dL (ref 8.9–10.3)
Chloride: 105 mmol/L (ref 98–111)
Creatinine, Ser: 0.91 mg/dL (ref 0.44–1.00)
GFR, Estimated: >60 mL/min (ref >60)
Glucose, Bld: 114 mg/dL — ABNORMAL HIGH (ref 70–99)
Potassium: 3.6 mmol/L (ref 3.5–5.1)
Sodium: 139 mmol/L (ref 135–145)
Total Bilirubin: 0.6 mg/dL (ref 0.3–1.2)
Total Protein: 7.8 g/dL (ref 6.5–8.1)

## 2022-12-09 LAB — LIPASE, BLOOD: Lipase: 25 U/L (ref 11–51)

## 2022-12-09 MED ORDER — ALUM & MAG HYDROXIDE-SIMETH 200-200-20 MG/5ML PO SUSP
30.0000 mL | Freq: Once | ORAL | Status: AC
  Administered 2022-12-09: 30 mL via ORAL
  Filled 2022-12-09: qty 30

## 2022-12-09 MED ORDER — PANTOPRAZOLE SODIUM 40 MG IV SOLR
40.0000 mg | Freq: Once | INTRAVENOUS | Status: AC
  Administered 2022-12-09: 40 mg via INTRAVENOUS
  Filled 2022-12-09: qty 10

## 2022-12-09 MED ORDER — MORPHINE SULFATE (PF) 4 MG/ML IV SOLN
4.0000 mg | Freq: Once | INTRAVENOUS | Status: AC
  Administered 2022-12-09: 4 mg via INTRAVENOUS
  Filled 2022-12-09: qty 1

## 2022-12-09 MED ORDER — SODIUM CHLORIDE 0.9 % IV BOLUS
500.0000 mL | Freq: Once | INTRAVENOUS | Status: AC
  Administered 2022-12-09: 500 mL via INTRAVENOUS

## 2022-12-09 MED ORDER — HYDROCODONE-ACETAMINOPHEN 5-325 MG PO TABS
1.0000 | ORAL_TABLET | Freq: Four times a day (QID) | ORAL | 0 refills | Status: AC | PRN
  Filled 2022-12-09: qty 6, 2d supply, fill #0

## 2022-12-09 MED ORDER — DICYCLOMINE HCL 10 MG PO CAPS
10.0000 mg | ORAL_CAPSULE | Freq: Once | ORAL | Status: AC
  Administered 2022-12-09: 10 mg via ORAL
  Filled 2022-12-09: qty 1

## 2022-12-09 MED ORDER — ONDANSETRON HCL 4 MG/2ML IJ SOLN
4.0000 mg | Freq: Once | INTRAMUSCULAR | Status: AC
  Administered 2022-12-09: 4 mg via INTRAVENOUS
  Filled 2022-12-09: qty 2

## 2022-12-09 MED ORDER — IOHEXOL 300 MG/ML  SOLN
100.0000 mL | Freq: Once | INTRAMUSCULAR | Status: AC | PRN
  Administered 2022-12-09: 85 mL via INTRAVENOUS

## 2022-12-09 MED ORDER — ONDANSETRON 4 MG PO TBDP
4.0000 mg | ORAL_TABLET | Freq: Three times a day (TID) | ORAL | 0 refills | Status: AC | PRN
  Filled 2022-12-09: qty 20, 7d supply, fill #0

## 2022-12-09 NOTE — ED Triage Notes (Signed)
Pt arrived POV from home, ambulatory in triage. Pt caox4 c/o epigastric abd pain that started at approx 0230, has been constant, feels like an ache, and did not improve with Pepcid or Pepto. Pt reports one episode of vomiting described as being like mucous. Pt denies diarrhea but states "loose stool." Pt afebrile.

## 2022-12-09 NOTE — ED Notes (Signed)
Dc instructions reviewed with patient. Patient voiced understanding. Dc with belongings.  °

## 2022-12-09 NOTE — ED Notes (Addendum)
Pt aware of the need for a urine... Unable to currently provide a sample... 

## 2022-12-09 NOTE — Discharge Instructions (Addendum)
As we discussed, your workup in the ER today was reassuring for acute findings.  Laboratory evaluation and CT imaging did not reveal any emergent concerns.  I have given you a prescription for zofran which is a nausea medication for you to take for any residual nausea or vomiting.  I have also given you a few doses of Vicodin for you to take as prescribed as needed for severe pain only.  He has been advised this is a narcotic and you need to be careful with walking as it can increase your fall risk and do not drive or operate heavy machinery while taking this medication as it can be sedating. Follow up with your primary care doctor in the next few days.  Return if development of any new or worsening symptoms.

## 2022-12-09 NOTE — ED Provider Notes (Signed)
[place] [facility]PARTMENT AT [facility] Provider Note   CSN: [id number] Arrival date & time: 12/09/22  4098     History  Chief Complaint  Patient presents with   Abdominal Pain    Connie Peterson is a 72 y.o. female.  Patient with history of GERD, hypertension, hyperlipidemia presents today with complaints of abdominal pain. She states that same began around 2:30am this morning and woke her from rest. She states that pain has been persistent since onset and is without aggravating or relieving factors.  She has never felt pain like this before.  She tried her home reflux medication including Pepto-Bismol and Protonix without relief.  She has had a few episodes of nausea and vomiting as well as loose stools. Denies hematemesis or hematochezia.  Denies any fevers, chills, chest pain, or shortness of breath.  Denies history of abdominal surgeries.  Pain is in the epigastric region and does not radiate. She has had a normal screening colonoscopy and normal cologuard screening last year.  The history is provided by the patient. No language interpreter was used.  Abdominal Pain Associated symptoms: diarrhea, nausea and vomiting        Home Medications Prior to Admission medications   Medication Sig Start Date End Date Taking? Authorizing Provider  losartan-hydrochlorothiazide (HYZAAR) 100-12.5 MG tablet Take 1 tablet by mouth daily. 10/16/22  Yes [provider]  folic acid (FOLVITE) 1 MG tablet Take 1 mg by mouth daily.    [provider]  inFLIXimab (REMICADE) 100 MG injection Inject 100 mg into the vein See admin instructions. Gets infusion every 8 weeks    [provider]  losartan (COZAAR) 50 MG tablet Take 1 tablet (50 mg total) by mouth daily. 02/05/12 02/04/13  Tereso Newcomer T, PA-C  methotrexate (RHEUMATREX) 2.5 MG tablet Take 15 mg by mouth once a week. Every sundday.Marland KitchenMarland KitchenCaution:Chemotherapy. Protect from light.    [provider]   metoprolol (LOPRESSOR) 100 MG tablet Take 0.5 tablets (50 mg total) by mouth 2 (two) times daily. 02/05/12   Tereso Newcomer T, PA-C  pantoprazole (PROTONIX) 40 MG tablet Take 40 mg by mouth daily.    [provider]  simvastatin (ZOCOR) 10 MG tablet Take 10 mg by mouth daily.    [provider]      Allergies    Patient has no known allergies.    Review of Systems   Review of Systems  Gastrointestinal:  Positive for abdominal pain, diarrhea, nausea and vomiting.  All other systems reviewed and are negative.   Physical Exam Updated Vital Signs BP (!) 145/66 (BP Location: Left Arm)   Pulse 84   Temp 98.1 F (36.7 C) (Oral)   Resp 18   Ht 5\' 2"  (1.575 m)   Wt 74.8 kg   SpO2 97%   BMI 30.18 kg/m  Physical Exam Vitals and nursing note reviewed.  Constitutional:      General: She is not in acute distress.    Appearance: Normal appearance. She is normal weight. She is not ill-appearing, toxic-appearing or diaphoretic.  HENT:     Head: Normocephalic and atraumatic.  Cardiovascular:     Rate and Rhythm: Normal rate and regular rhythm.     Heart sounds: Normal heart sounds.  Pulmonary:     Effort: Pulmonary effort is normal. No respiratory distress.     Breath sounds: Normal breath sounds.  Abdominal:     General: Abdomen is flat.     Palpations: Abdomen is  soft.     Tenderness: There is abdominal tenderness in the epigastric area.  Musculoskeletal:        General: Normal range of motion.     Cervical back: Normal range of motion.  Skin:    General: Skin is warm and dry.  Neurological:     General: No focal deficit present.     Mental Status: She is alert.  Psychiatric:        Mood and Affect: Mood normal.        Behavior: Behavior normal.     ED Results / Procedures / Treatments   Labs (all labs ordered are listed, but only abnormal results are displayed) Labs Reviewed  COMPREHENSIVE METABOLIC PANEL - Abnormal; Notable for the following  components:      Result Value   Glucose, Bld 114 (*)    All other components within normal limits  CBC - Abnormal; Notable for the following components:   HCT 35.0 (*)    All other components within normal limits  LIPASE, BLOOD  URINALYSIS, ROUTINE W REFLEX MICROSCOPIC    EKG EKG Interpretation  Date/Time:  Wednesday Dec 09 2022 10:04:43 EDT Ventricular Rate:  82 PR Interval:  115 QRS Duration: 97 QT Interval:  390 QTC Calculation: 456 R Axis:   31 Text Interpretation: Sinus rhythm Borderline short PR interval Low voltage, precordial leads RSR' in V1 or V2, right VCD or RVH Baseline wander in lead(s) V1 No significant change since last tracing Confirmed by Elayne Snare ([id number]) on 12/09/2022 11:11:57 AM  Radiology CT ABDOMEN PELVIS W CONTRAST  Result Date: 12/09/2022 CLINICAL DATA:  Acute onset of abdominal pain this morning. Vomiting. EXAM: CT ABDOMEN AND PELVIS WITH CONTRAST TECHNIQUE: Multidetector CT imaging of the abdomen and pelvis was performed using the standard protocol following bolus administration of intravenous contrast. RADIATION DOSE REDUCTION: This exam was performed according to the departmental dose-optimization program which includes automated exposure control, adjustment of the mA and/or kV according to patient size and/or use of iterative reconstruction technique. CONTRAST:  85mL OMNIPAQUE IOHEXOL 300 MG/ML  SOLN COMPARISON:  None Available. FINDINGS: Lower Chest: No acute findings. Hepatobiliary: 2.4 cm heterogeneous low-attenuation lesion seen in the anterior dome of the left hepatic lobe. This has nonspecific characteristics, although it could represent a hemangioma. No other liver lesions identified. Gallbladder is unremarkable. No evidence of biliary ductal dilatation. Pancreas:  No mass or inflammatory changes. Spleen: Within normal limits in size and appearance. Adrenals/Urinary Tract: No suspicious masses identified. Benign appearing renal cysts are noted (No  followup imaging is recommended). Mild diffuse right renal atrophy also noted. No evidence of ureteral calculi or hydronephrosis. Stomach/Bowel: No evidence of obstruction, inflammatory process or abnormal fluid collections. Normal appendix visualized. Diverticulosis is seen mainly involving the sigmoid colon, however there is no evidence of diverticulitis. Vascular/Lymphatic: No pathologically enlarged lymph nodes. No acute vascular findings. Reproductive: Prior hysterectomy noted. Adnexal regions are unremarkable in appearance. Other:  None. Musculoskeletal:  No suspicious bone lesions identified. IMPRESSION: No acute findings. Colonic diverticulosis, without radiographic evidence of diverticulitis. 2.4 cm indeterminate low-attenuation lesion in left hepatic lobe. Nonemergent outpatient abdomen MRI without and with contrast is recommended for further characterization. Electronically Signed   By: Danae Orleans M.D.   On: 12/09/2022 11:50    Procedures Procedures    Medications Ordered in ED Medications  sodium chloride 0.9 % bolus 500 mL (has no administration in time range)  ondansetron (ZOFRAN) injection 4 mg (has no administration in time range)  morphine (PF) 4 MG/ML injection 4 mg (has no administration in time range)    ED Course/ Medical Decision Making/ A&P                             Medical Decision Making Amount and/or Complexity of Data Reviewed Labs: ordered. Radiology: ordered.  Risk Prescription drug management.   This patient is a 72 y.o. female who presents to the ED for concern of abdominal pain, this involves an extensive number of treatment options, and is a complaint that carries with it a high risk of complications and morbidity. The emergent differential diagnosis prior to evaluation includes, but is not limited to,  AAA, gastroenteritis, appendicitis, Bowel obstruction, Bowel perforation. Gastroparesis, DKA, Hernia, Inflammatory bowel disease, mesenteric ischemia,  pancreatitis, peritonitis SBP, volvulus.  This is not an exhaustive differential.   Past Medical History / Co-morbidities / Social History: history of GERD, hypertension, hyperlipidemia  Physical Exam: Physical exam performed. The pertinent findings include: epigastric TTP  Lab Tests: I ordered, and personally interpreted labs.  The pertinent results include:  no acute laboratory findings   Imaging Studies: I ordered imaging studies including CT abdomen pelvis. I independently visualized and interpreted imaging which showed   No acute findings.   Colonic diverticulosis, without radiographic evidence of diverticulitis.   2.4 cm indeterminate low-attenuation lesion in left hepatic lobe. Nonemergent outpatient abdomen MRI without and with contrast is recommended for further characterization.  I agree with the radiologist interpretation.   Cardiac Monitoring:  The patient was maintained on a cardiac monitor.  My attending physician Dr. Theresia Lo viewed and interpreted the cardiac monitored which showed an underlying rhythm of: no STEMI. I agree with this interpretation.   Medications: I ordered medication including fluids, zofran, bentyl, morphine, GI cocktail  for pain. Reevaluation of the patient after these medicines showed that the patient improved. I have reviewed the patients home medicines and have made adjustments as needed.   Disposition:  After consideration of the diagnostic results and the patients response to treatment, I feel that emergency department workup does not suggest an emergent condition requiring admission or immediate intervention beyond what has been performed at this time. Patient is nontoxic, nonseptic appearing, in no apparent distress.  Patient's pain and other symptoms adequately managed in emergency department.  Fluid bolus given.  Labs, imaging and vitals reviewed.  Patient does not meet the SIRS or Sepsis criteria.  On repeat exam patient does not have  a surgical abdomin and there are no peritoneal signs.  No indication of appendicitis, bowel obstruction, bowel perforation, cholecystitis, diverticulitis. After pain meds, patient is feeling some improvement.  She is able to eat and drink without any subsequent episodes of nausea or vomiting. Patient discharged home with Zofran for symptomatic treatment and given strict instructions for follow-up with their primary care physician.  Also given prescription for a few doses of Vicodin per her request for pain.  PDMP reviewed.  Patient advised not to drive or operate heavy machinery while taking this medication. Evaluation and diagnostic testing in the emergency department does not suggest an emergent condition requiring admission or immediate intervention beyond what has been performed at this time.  Plan for discharge with close PCP follow-up.  Patient is understanding and amenable with plan, educated on red flag symptoms that would prompt immediate return.  Patient discharged in stable condition.   Final Clinical Impression(s) / ED Diagnoses Final diagnoses:  Epigastric pain  Nausea  vomiting and diarrhea    Rx / DC Orders ED Discharge Orders          Ordered    HYDROcodone-acetaminophen (NORCO/VICODIN) 5-325 MG tablet  Every 6 hours PRN        12/09/22 1403    ondansetron (ZOFRAN-ODT) 4 MG disintegrating tablet  Every 8 hours PRN        12/09/22 1403          An After Visit Summary was printed and given to the patient.     Silva Bandy, PA-C 12/09/22 1404    Elayne Snare K, DO 12/09/22 1520
